# Patient Record
Sex: Male | Born: 1945 | Race: White | Hispanic: No | Smoking: Former smoker
Health system: Southern US, Community
[De-identification: ages and names within clinical notes are randomized; demographics above are authoritative.]

## PROBLEM LIST (undated history)

## (undated) DIAGNOSIS — G8929 Other chronic pain: Secondary | ICD-10-CM

## (undated) DIAGNOSIS — K219 Gastro-esophageal reflux disease without esophagitis: Secondary | ICD-10-CM

## (undated) DIAGNOSIS — B009 Herpesviral infection, unspecified: Secondary | ICD-10-CM

## (undated) DIAGNOSIS — M549 Dorsalgia, unspecified: Secondary | ICD-10-CM

## (undated) DIAGNOSIS — F1120 Opioid dependence, uncomplicated: Secondary | ICD-10-CM

## (undated) DIAGNOSIS — I10 Essential (primary) hypertension: Secondary | ICD-10-CM

## (undated) DIAGNOSIS — G14 Postpolio syndrome: Secondary | ICD-10-CM

## (undated) DIAGNOSIS — E785 Hyperlipidemia, unspecified: Secondary | ICD-10-CM

## (undated) HISTORY — DX: Dorsalgia, unspecified: M54.9

## (undated) HISTORY — DX: Opioid dependence, uncomplicated: F11.20

## (undated) HISTORY — DX: Herpesviral infection, unspecified: B00.9

## (undated) HISTORY — DX: Gastro-esophageal reflux disease without esophagitis: K21.9

## (undated) HISTORY — DX: Other chronic pain: G89.29

## (undated) HISTORY — DX: Postpolio syndrome: G14

## (undated) HISTORY — DX: Hyperlipidemia, unspecified: E78.5

## (undated) HISTORY — DX: Essential (primary) hypertension: I10

---

## 2004-04-28 ENCOUNTER — Ambulatory Visit: Payer: Self-pay | Admitting: Psychiatry

## 2004-04-28 ENCOUNTER — Inpatient Hospital Stay (HOSPITAL_COMMUNITY): Admission: RE | Admit: 2004-04-28 | Discharge: 2004-05-03 | Payer: Self-pay | Admitting: Psychiatry

## 2005-12-14 ENCOUNTER — Ambulatory Visit: Payer: Self-pay | Admitting: Anesthesiology

## 2006-01-16 ENCOUNTER — Emergency Department: Payer: Self-pay | Admitting: Emergency Medicine

## 2006-02-06 ENCOUNTER — Ambulatory Visit: Payer: Self-pay | Admitting: Anesthesiology

## 2006-02-09 ENCOUNTER — Ambulatory Visit: Payer: Self-pay | Admitting: Otolaryngology

## 2006-02-09 ENCOUNTER — Other Ambulatory Visit: Payer: Self-pay

## 2006-02-16 ENCOUNTER — Ambulatory Visit: Payer: Self-pay | Admitting: Otolaryngology

## 2006-03-15 ENCOUNTER — Ambulatory Visit: Payer: Self-pay | Admitting: Anesthesiology

## 2006-04-13 ENCOUNTER — Ambulatory Visit: Payer: Self-pay | Admitting: Anesthesiology

## 2006-05-17 ENCOUNTER — Ambulatory Visit: Payer: Self-pay | Admitting: Anesthesiology

## 2006-05-22 ENCOUNTER — Ambulatory Visit: Payer: Self-pay | Admitting: Anesthesiology

## 2006-07-25 ENCOUNTER — Ambulatory Visit: Payer: Self-pay | Admitting: Anesthesiology

## 2006-08-01 ENCOUNTER — Ambulatory Visit: Payer: Self-pay | Admitting: Anesthesiology

## 2006-09-20 ENCOUNTER — Ambulatory Visit: Payer: Self-pay | Admitting: Anesthesiology

## 2006-11-28 ENCOUNTER — Inpatient Hospital Stay (HOSPITAL_COMMUNITY): Admission: RE | Admit: 2006-11-28 | Discharge: 2006-11-29 | Payer: Self-pay | Admitting: Neurosurgery

## 2007-01-11 ENCOUNTER — Encounter: Admission: RE | Admit: 2007-01-11 | Discharge: 2007-01-11 | Payer: Self-pay | Admitting: Neurosurgery

## 2007-05-23 ENCOUNTER — Ambulatory Visit: Payer: Self-pay | Admitting: Family Medicine

## 2007-06-22 ENCOUNTER — Ambulatory Visit: Payer: Self-pay | Admitting: Anesthesiology

## 2007-06-28 ENCOUNTER — Ambulatory Visit: Payer: Self-pay | Admitting: Anesthesiology

## 2007-08-01 ENCOUNTER — Encounter: Admission: RE | Admit: 2007-08-01 | Discharge: 2007-08-01 | Payer: Self-pay | Admitting: Neurosurgery

## 2007-08-15 ENCOUNTER — Ambulatory Visit: Payer: Self-pay | Admitting: Anesthesiology

## 2007-09-06 ENCOUNTER — Ambulatory Visit: Payer: Self-pay | Admitting: Anesthesiology

## 2007-09-26 ENCOUNTER — Ambulatory Visit: Payer: Self-pay | Admitting: Anesthesiology

## 2007-10-10 ENCOUNTER — Ambulatory Visit: Payer: Self-pay | Admitting: Anesthesiology

## 2007-11-08 ENCOUNTER — Ambulatory Visit: Payer: Self-pay | Admitting: Anesthesiology

## 2007-12-04 ENCOUNTER — Emergency Department: Payer: Self-pay | Admitting: Emergency Medicine

## 2007-12-13 ENCOUNTER — Ambulatory Visit: Payer: Self-pay | Admitting: Anesthesiology

## 2007-12-28 ENCOUNTER — Inpatient Hospital Stay: Payer: Self-pay | Admitting: Internal Medicine

## 2007-12-28 ENCOUNTER — Other Ambulatory Visit: Payer: Self-pay

## 2007-12-31 ENCOUNTER — Ambulatory Visit: Payer: Self-pay | Admitting: Anesthesiology

## 2008-01-16 ENCOUNTER — Ambulatory Visit: Payer: Self-pay | Admitting: Anesthesiology

## 2008-02-09 ENCOUNTER — Emergency Department: Payer: Self-pay | Admitting: Emergency Medicine

## 2008-02-13 ENCOUNTER — Ambulatory Visit: Payer: Self-pay | Admitting: Anesthesiology

## 2008-03-11 ENCOUNTER — Ambulatory Visit: Payer: Self-pay | Admitting: Anesthesiology

## 2008-11-07 ENCOUNTER — Ambulatory Visit: Payer: Self-pay | Admitting: Neurosurgery

## 2009-01-20 ENCOUNTER — Ambulatory Visit: Payer: Self-pay | Admitting: Family Medicine

## 2009-02-11 ENCOUNTER — Ambulatory Visit: Payer: Self-pay | Admitting: Gastroenterology

## 2009-02-24 ENCOUNTER — Ambulatory Visit: Payer: Self-pay | Admitting: Gastroenterology

## 2009-05-31 ENCOUNTER — Ambulatory Visit: Payer: Self-pay | Admitting: Neurosurgery

## 2009-06-09 ENCOUNTER — Ambulatory Visit (HOSPITAL_COMMUNITY): Admission: RE | Admit: 2009-06-09 | Discharge: 2009-06-09 | Payer: Self-pay | Admitting: Neurosurgery

## 2009-06-11 ENCOUNTER — Ambulatory Visit (HOSPITAL_COMMUNITY): Admission: RE | Admit: 2009-06-11 | Discharge: 2009-06-11 | Payer: Self-pay | Admitting: Interventional Radiology

## 2009-06-26 ENCOUNTER — Encounter: Payer: Self-pay | Admitting: Interventional Radiology

## 2009-07-30 ENCOUNTER — Ambulatory Visit (HOSPITAL_COMMUNITY): Admission: RE | Admit: 2009-07-30 | Discharge: 2009-07-30 | Payer: Self-pay | Admitting: Interventional Radiology

## 2009-08-21 ENCOUNTER — Encounter: Payer: Self-pay | Admitting: Interventional Radiology

## 2010-09-08 LAB — BASIC METABOLIC PANEL
BUN: 14 mg/dL (ref 6–23)
Calcium: 9.4 mg/dL (ref 8.4–10.5)
Creatinine, Ser: 0.69 mg/dL (ref 0.4–1.5)
GFR calc non Af Amer: >60 mL/min (ref >60)
Glucose, Bld: 97 mg/dL (ref 70–99)
Potassium: 4.3 mEq/L (ref 3.5–5.1)
Sodium: 140 mEq/L (ref 135–145)

## 2010-09-08 LAB — TYPE AND SCREEN: Antibody Screen: NEGATIVE

## 2010-09-08 LAB — DIFFERENTIAL
Basophils Absolute: 0.1 10*3/uL (ref 0.0–0.1)
Lymphocytes Relative: 22 % (ref 12–46)
Monocytes Absolute: 0.8 10*3/uL (ref 0.1–1.0)
Neutro Abs: 4.5 10*3/uL (ref 1.7–7.7)

## 2010-09-08 LAB — CBC
MCV: 92.5 fL (ref 78.0–100.0)
Platelets: 251 10*3/uL (ref 150–400)
RDW: 14.6 % (ref 11.5–15.5)

## 2010-09-08 LAB — HEPATIC FUNCTION PANEL
ALT: 16 U/L (ref 0–53)
AST: 22 U/L (ref 0–37)
Bilirubin, Direct: 0.1 mg/dL (ref 0.0–0.3)

## 2010-09-08 LAB — APTT: aPTT: 30 seconds (ref 24–37)

## 2010-09-09 ENCOUNTER — Ambulatory Visit
Admission: RE | Admit: 2010-09-09 | Discharge: 2010-09-09 | Disposition: A | Discharge status: Home / Self Care | Payer: Medicare Other | Source: Ambulatory Visit | Attending: Neurosurgery | Admitting: Neurosurgery

## 2010-09-09 ENCOUNTER — Other Ambulatory Visit: Payer: Self-pay | Admitting: Neurosurgery

## 2010-09-09 DIAGNOSIS — M542 Cervicalgia: Secondary | ICD-10-CM

## 2010-09-20 LAB — CBC
Platelets: 272 10*3/uL (ref 150–400)
WBC: 5.5 10*3/uL (ref 4.0–10.5)

## 2010-09-20 LAB — BASIC METABOLIC PANEL
BUN: 9 mg/dL (ref 6–23)
Calcium: 8.8 mg/dL (ref 8.4–10.5)
Creatinine, Ser: 0.56 mg/dL (ref 0.4–1.5)
GFR calc non Af Amer: >60 mL/min (ref >60)
Potassium: 3.4 mEq/L — ABNORMAL LOW (ref 3.5–5.1)

## 2010-09-20 LAB — CREATININE, SERUM
Creatinine, Ser: 0.58 mg/dL (ref 0.4–1.5)
GFR calc non Af Amer: >60 mL/min (ref >60)

## 2010-09-20 LAB — BUN: BUN: 7 mg/dL (ref 6–23)

## 2010-09-20 LAB — APTT: aPTT: 31 seconds (ref 24–37)

## 2010-09-22 ENCOUNTER — Ambulatory Visit: Payer: Self-pay | Admitting: Neurosurgery

## 2010-09-30 ENCOUNTER — Encounter (HOSPITAL_COMMUNITY)
Admission: RE | Admit: 2010-09-30 | Discharge: 2010-09-30 | Disposition: A | Discharge status: Home / Self Care | Payer: Medicare Other | Source: Ambulatory Visit | Attending: Neurosurgery | Admitting: Neurosurgery

## 2010-09-30 ENCOUNTER — Other Ambulatory Visit (HOSPITAL_COMMUNITY): Payer: Self-pay | Admitting: Neurosurgery

## 2010-09-30 ENCOUNTER — Ambulatory Visit (HOSPITAL_COMMUNITY)
Admission: RE | Admit: 2010-09-30 | Discharge: 2010-09-30 | Disposition: A | Discharge status: Home / Self Care | Payer: Medicare Other | Source: Ambulatory Visit | Attending: Neurosurgery | Admitting: Neurosurgery

## 2010-09-30 DIAGNOSIS — Z01810 Encounter for preprocedural cardiovascular examination: Secondary | ICD-10-CM | POA: Insufficient documentation

## 2010-09-30 DIAGNOSIS — R059 Cough, unspecified: Secondary | ICD-10-CM | POA: Insufficient documentation

## 2010-09-30 DIAGNOSIS — F17200 Nicotine dependence, unspecified, uncomplicated: Secondary | ICD-10-CM | POA: Insufficient documentation

## 2010-09-30 DIAGNOSIS — R05 Cough: Secondary | ICD-10-CM | POA: Insufficient documentation

## 2010-09-30 DIAGNOSIS — M5020 Other cervical disc displacement, unspecified cervical region: Secondary | ICD-10-CM | POA: Insufficient documentation

## 2010-09-30 DIAGNOSIS — Z01818 Encounter for other preprocedural examination: Secondary | ICD-10-CM | POA: Insufficient documentation

## 2010-09-30 DIAGNOSIS — Z01812 Encounter for preprocedural laboratory examination: Secondary | ICD-10-CM | POA: Insufficient documentation

## 2010-09-30 DIAGNOSIS — I10 Essential (primary) hypertension: Secondary | ICD-10-CM | POA: Insufficient documentation

## 2010-09-30 DIAGNOSIS — M5000 Cervical disc disorder with myelopathy, unspecified cervical region: Secondary | ICD-10-CM

## 2010-09-30 DIAGNOSIS — J449 Chronic obstructive pulmonary disease, unspecified: Secondary | ICD-10-CM | POA: Insufficient documentation

## 2010-09-30 DIAGNOSIS — J4489 Other specified chronic obstructive pulmonary disease: Secondary | ICD-10-CM | POA: Insufficient documentation

## 2010-09-30 LAB — COMPREHENSIVE METABOLIC PANEL
ALT: 13 U/L (ref 0–53)
AST: 20 U/L (ref 0–37)
Alkaline Phosphatase: 102 U/L (ref 39–117)
CO2: 34 mEq/L — ABNORMAL HIGH (ref 19–32)
Glucose, Bld: 89 mg/dL (ref 70–99)
Potassium: 4.7 mEq/L (ref 3.5–5.1)
Sodium: 137 mEq/L (ref 135–145)
Total Protein: 7 g/dL (ref 6.0–8.3)

## 2010-09-30 LAB — CBC
HCT: 42.8 % (ref 39.0–52.0)
Hemoglobin: 14 g/dL (ref 13.0–17.0)
RDW: 14 % (ref 11.5–15.5)
WBC: 7.4 10*3/uL (ref 4.0–10.5)

## 2010-09-30 LAB — SURGICAL PCR SCREEN: Staphylococcus aureus: NEGATIVE

## 2010-10-01 LAB — TYPE AND SCREEN

## 2010-10-04 ENCOUNTER — Inpatient Hospital Stay (HOSPITAL_COMMUNITY)
Admission: RE | Admit: 2010-10-04 | Discharge: 2010-10-05 | DRG: 473 | Disposition: A | Discharge status: Home / Self Care | Payer: Medicare Other | Source: Ambulatory Visit | Attending: Neurosurgery | Admitting: Neurosurgery

## 2010-10-04 ENCOUNTER — Inpatient Hospital Stay (HOSPITAL_COMMUNITY): Payer: Medicare Other

## 2010-10-04 DIAGNOSIS — M5000 Cervical disc disorder with myelopathy, unspecified cervical region: Principal | ICD-10-CM | POA: Diagnosis present

## 2010-10-04 DIAGNOSIS — F17200 Nicotine dependence, unspecified, uncomplicated: Secondary | ICD-10-CM | POA: Diagnosis present

## 2010-10-04 DIAGNOSIS — M4802 Spinal stenosis, cervical region: Secondary | ICD-10-CM | POA: Diagnosis present

## 2010-10-04 DIAGNOSIS — I10 Essential (primary) hypertension: Secondary | ICD-10-CM | POA: Diagnosis present

## 2010-10-04 DIAGNOSIS — B91 Sequelae of poliomyelitis: Secondary | ICD-10-CM

## 2010-11-02 NOTE — Op Note (Signed)
NAME:  Alexander Wong NO.:  [id number]   MEDICAL RECORD NO.:  [id number]          PATIENT TYPE:  INP   LOCATION:  3172                         FACILITY:  [facility]   PHYSICIAN:  Kathaleen Maser. Pool, M.D.    DATE OF BIRTH:  02/07/1946   DATE OF PROCEDURE:  11/28/2006  DATE OF DISCHARGE:                               OPERATIVE REPORT   PREOPERATIVE DIAGNOSIS:  Cervical stenosis with myelopathy.   POSTOPERATIVE DIAGNOSIS:  Cervical stenosis with myelopathy.   PROCEDURE NOTE:  C4-C5, C5-C6 and C6-C7 anterior cervical discectomy and  fusion with allograft and anterior plating.   SURGEON:  Kathaleen Maser. Pool, M.D.   ASSISTANT:  Reinaldo Meeker, M.D.   ANESTHESIA:  General endotracheal anesthesia.   INDICATIONS FOR PROCEDURE:  Mr. Alexander Wong is a 65 year old male who suffers  from progressive upper extremity weakness and lower extremity ataxia.  Workup demonstrates evidence of marked cervical stenosis with spinal  cord and nerve root compression at C4-C5, C5-C6 and C6-C7 levels.  The  patient has been counseled as to his options.  He has decided to proceed  with a three level anterior cervical discectomy and fusion with  allograft and plating in hopes of improving his symptoms.   OPERATIVE NOTE:  The patient was brought to the operating room and  placed on the operating table in the supine position. After an adequate  level of anesthesia was achieved, the patient was positioned supine with  his neck slightly extended and held in place with halter traction.  The  patient's anterior cervical region was prepped and draped sterilely.  A  10 blade was used to make a linear incision overlying the C5-C6  interspace.  This was carried down sharply to the platysma.  The  platysma was then divided vertically. Dissection continued along the  medial border of the sternomastoid muscle and carotid sheath.  The  trachea and esophagus were mobilized and retracted towards the left.  The  prevertebral fascia was stripped off the anterior spinal column.  The longus colli muscle was elevated bilaterally using electrocautery.  Deep self-retaining retractor was placed.  Intraoperative fluoroscopy  was used and C4-C5, C5-C6, and C6-C7 levels were confirmed.   The disc space at the L3 level was then incised with a 15 blade in  rectangular fashion.  Anterior osteophytes were removed using Leksell  rongeurs.  Aggressive discectomies were performed at all three levels  using pituitary rongeurs, forward and back Carlen curets, Kerrison  rongeurs, and a high speed drill.  All elements of the disc were removed  down to the level of the posterior annulus.  The microscope was then  brought to the field and used throughout the remainder the discectomy.  Starting first at C4-C5, the remaining aspects of the annulus and  osteophytes were removed using the high speed drill down to the  posterior longitudinal ligament.  The posterior longitudinal ligament  was elevated and resected in a piecemeal fashion using Kerrison  rongeurs.  The underlying thecal sac was identified.  A wide central  decompression was then performed under cutting the bodies  of C4 and C5.  Decompression proceeded out each neural foramen.  Wide anterior  foraminotomies were then performed along the course of the exiting C5  nerve roots bilaterally.  At this point, a very thorough decompression  had been achieved.  There was no evidence of injury to the thecal sac or  nerve roots.  The procedure was then repeated at C5-C6 and C6-C7, again  without complication.  The wound was then irrigated with antibiotic  solution.  Gelfoam was placed topically for hemostasis which was found  be good.   A 5 mm Lifenet allograft wedge then placed at C5-C6 and impacted to  approximately 1 mm beyond the anterior cortical surface of C5-C6.  6 mm  grafts were placed at C4-C5 and C6-C7.  A 62 mm Atlantis anterior  cervical plate was then  placed over the C4, C5, C6 and C7 levels.  This  was attached under fluoroscopic guidance using 13 mm variable angle  screws, two each at all four levels.  All screws were given final  tightening and noted to be solid in bone.  The locking screws were then  engaged at all levels.  Final images revealed good position bone grafts  and hardware.  The wound was then irrigated with antibiotic solution.  Gelfoam was placed topically for hemostasis.  We then closed in typical  fashion.  Steri-Strips and sterile dressings were applied.  There were  no intraoperative complications.  The patient tolerated the procedure  well and he returns to the recovery room for postoperative care.           ______________________________  Kathaleen Maser. Pool, M.D.     HAP/MEDQ  D:  11/28/2006  T:  11/28/2006  Job:  [id number]

## 2010-11-03 ENCOUNTER — Ambulatory Visit
Admission: RE | Admit: 2010-11-03 | Discharge: 2010-11-03 | Disposition: A | Discharge status: Home / Self Care | Payer: Medicare Other | Source: Ambulatory Visit | Attending: Neurosurgery | Admitting: Neurosurgery

## 2010-11-03 ENCOUNTER — Other Ambulatory Visit: Payer: Self-pay | Admitting: Neurosurgery

## 2010-11-03 DIAGNOSIS — M542 Cervicalgia: Secondary | ICD-10-CM

## 2010-11-04 NOTE — Op Note (Signed)
NAME:  Alexander Wong, Alexander Wong NO.:  [id number]  MEDICAL RECORD NO.:  [id number]           PATIENT TYPE:  I  LOCATION:  3533                         FACILITY:  [facility]  PHYSICIAN:  Kathaleen Maser. Malea Swilling, M.D.    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  PROCEDURE NAME:  Re-exploration of C4-C7 anterior cervical fusion. Removal of C4-C7 anterior plate instrumentation.  C3-4 anterior cervical diskectomy and fusion with allograft and plating.  SURGEON:  Kathaleen Maser. Jevante Hollibaugh, MD  ASSISTANT:  Donalee Citrin, MD  ANESTHESIA:  General endotracheal.  INDICATIONS:  Alexander Wong is a 65 year old male with history of neck pain and bilateral upper extremity weakness.  Workup demonstrates evidence of a large central disk herniation at C3-4.  The patient is status post previous C4 to C7 anterior fusion which appears solid.  The patient has been counseled as to his options.  He decided to proceed with C3-4 anterior cervical facet fusion, allograft plating in hopes of improving his symptoms.  The patient was brought to the operating room and placed on the operative table in supine position.  After adequate level of anesthesia was achieved, the patient was placed supine with the neck slightly extended, held in place with halter traction.  The patient's anterior cervical region was prepped and draped sterilely.  A 10 blade was used to make a curvilinear skin incision overlying the C3-4 interspace.  This was carried down sharply to the platysma.  The platysma was then divided vertically.  Dissection proceeded along  the medial border of the sternocleidomastoid muscle and carotid sheath.  Trachea and esophagus are mobilized, tracked towards the left.  Prevertebral fascia was stripped off the anterior spinal column.  Longus colli muscles were elevated bilaterally using electrocautery.  Deep self-retaining retractor was placed.  Intraoperative fluoroscopy was used and levels were confirmed.  Previously placed anterior plate instrumentation from C4 to C7 was dissected free.  The plate was disassembled and removed. Fusions were inspected and found to be solid.  Disk space at C3-4 was then incised with 15 blade in rectangular fashion.  Wide disk space clean-out was then achieved using pituitary rongeurs, forward and backward Karlin curettes, Kerrison rongeurs, high-speed drill.  All elements of the disk were removed down to the level of the posterior annulus.  Microscope was then brought into the field and used throughout the remainder of the diskectomy.  The remaining aspects of annulus and osteophytes were removed using high-speed drill down to the level of theposterior longitudinal ligament.  The posterior longitudinal ligament was then elevated and resected in piecemeal fashion using Kerrison rongeurs.  The underlying thecal sac was then identified.  Wide central decompression was then performed undercutting the bodies of C3 and C4. Decompression was then proceeded out to the edges of the neural  foramen. Wide anterior foraminotomies were then performed along the course of the exiting C4 nerve roots bilaterally.  At this point, a very thorough decompression had been achieved.  There was no injury to thecal sac or nerve roots.  Wound was then irrigated with antibiotic solution. Gelfoam was placed topically for hemostasis found to be good.  A 7-mm Cornerstone allograft wedge was then placed in between the C3 and C4 levels.  This was then attached under fluoroscopic guidance using 13-mm variable angle  screws, two each at both levels.  All screws given final tightening.  C-arm was then used to take final images, revealed good position of bone grafts, Hardware at proper operative level with normal alignment of spine.  The wound was then irrigated with antibiotic solution.  Hemostasis in the wound was then achieved with electrocautery.  The wound was then closed in layers.  Steri-Strips and sterile dressings were applied.  There were no complications.  The patient tolerated the procedure well, and he returned to the recovery room postoperatively.          ______________________________ Kathaleen Maser Shanti Eichel, M.D.     HAP/MEDQ  D:  10/04/2010  T:  10/05/2010  Job:  [id number]  Electronically Signed by Julio Sicks M.D. on 11/04/2010 11:15:34 AM

## 2010-11-05 ENCOUNTER — Encounter (HOSPITAL_COMMUNITY)
Admission: RE | Admit: 2010-11-05 | Discharge: 2010-11-05 | Disposition: A | Discharge status: Home / Self Care | Payer: Medicare Other | Source: Ambulatory Visit | Attending: Neurosurgery | Admitting: Neurosurgery

## 2010-11-05 LAB — DIFFERENTIAL
Eosinophils Relative: 2 % (ref 0–5)
Lymphocytes Relative: 45 % (ref 12–46)
Lymphs Abs: 2.4 10*3/uL (ref 0.7–4.0)
Neutro Abs: 2.2 10*3/uL (ref 1.7–7.7)

## 2010-11-05 LAB — BASIC METABOLIC PANEL
BUN: 12 mg/dL (ref 6–23)
CO2: 35 mEq/L — ABNORMAL HIGH (ref 19–32)
Chloride: 99 mEq/L (ref 96–112)
Glucose, Bld: 88 mg/dL (ref 70–99)
Potassium: 4.2 mEq/L (ref 3.5–5.1)
Sodium: 138 mEq/L (ref 135–145)

## 2010-11-05 LAB — TYPE AND SCREEN

## 2010-11-05 LAB — CBC
HCT: 39.2 % (ref 39.0–52.0)
Hemoglobin: 12.9 g/dL — ABNORMAL LOW (ref 13.0–17.0)
MCV: 93.1 fL (ref 78.0–100.0)
RBC: 4.21 MIL/uL — ABNORMAL LOW (ref 4.22–5.81)
RDW: 14.2 % (ref 11.5–15.5)
WBC: 5.4 10*3/uL (ref 4.0–10.5)

## 2010-11-05 LAB — SURGICAL PCR SCREEN
MRSA, PCR: NEGATIVE
Staphylococcus aureus: NEGATIVE

## 2010-11-05 NOTE — Discharge Summary (Signed)
NAME:  Alexander Wong, Alexander Wong NO.:  [id number]   MEDICAL RECORD NO.:  [id number]          PATIENT TYPE:  IPS   LOCATION:  0504                          [facility]   PHYSICIAN:  Geoffery Lyons, M.D.      DATE OF BIRTH:  18-Apr-1946   DATE OF ADMISSION:  04/28/2004  DATE OF DISCHARGE:  05/03/2004                                 DISCHARGE SUMMARY   CHIEF COMPLAINT AND PRESENT ILLNESS:  This was the first admission to [facility] for this 65 year old, divorced, white male,  voluntarily admitted.  Requested detox from opiates.  Presented himself at  Mental [facility] [place] who referred him.  Prescribed 120 mg  of OxyContin every day for chronic pain in his back __________ post polio  syndrome.  Also prescribed Percocet one to two every six hours as needed for  breakthrough pain.  Has been using OxyContin for about seven years.  Reports  he has been abusing it on and off for most of that time.  Has been chewing  OxyContin that he gets off the street, taking up to 200 to 280 mg per day.  The day before, had used up to 300.  Tried to chew the patch.  Attending a  pain clinic here in [facility].  They prescribed methadone.  It was not  enough, so he went back to using OxyContin.   PAST PSYCHIATRIC HISTORY:  First time [employer].  Spent 28 days in  [facility] in [place] to get off alcohol.   ALCOHOL/DRUG HISTORY:  As already stated.  Ongoing abuse of OxyContin,  history of alcohol abuse, sober since 1992, history of cocaine abuse in the  distant past as well as of heroin.   PAST MEDICAL HISTORY:  1.  Dyslipidemia.  2.  Chronic back pain.  3.  Post polio syndrome.  __________.  4.  History of ulcer from NSAIDs (nonsteroidal anti-inflammatories).   LABORATORY WORKUP:  TSH 1.022.  Urine drug screen positive for cocaine,  opiates and benzodiazepines.  CBC within normal limits.  Other blood  chemistries findings within  normal limits.   MENTAL STATUS EXAM:  Upon admission exam reveals a fully alert male,  pleasant, cooperative, anxious, tearful, endorsing significant pain.  Speech  was normal in pace, tempo and production.  Mood was depressed.  Affect was  anxious, depressed.  Thought processes were logical, clear, goal-directed.  No delusions.  No hallucinations.  Wanting to find ways of dealing with the  pain.  Cognition well-preserved.   ADMISSION DIAGNOSES:   AXIS I:  1.  Opioid abuse, rule out dependence.  2.  Polysubstance abuse.  3.  Depressive disorder, not otherwise specified.   AXIS II:  No diagnosis.   AXIS III:  1.  Herpes.  2.  Post polio syndrome.  3.  Chronic back and neck pain.   AXIS IV:  Moderate.   AXIS V:  Global Assessment of Functioning upon admission was 25; highest  Global Assessment of Functioning in the last year 60.   COURSE IN HOSPITAL:  He was admitted and started in individual and group  psychotherapy.  He was detoxified with Librium and clonidine.  He was placed  on lovastatin 40 mg daily, Nexium 40 mg daily, Allegra 180 mg daily,  Nasacort two sprays each nostril every day, Lotrel 5/20 mg every day,  Valtrex 500 every 12 hours, Zoloft 25 mg daily, Neurontin 300 at night.  He  was later started on Flexeril 5 mg three times a day.  He had a hard time  with the detox.  Severe muscle ache, back ache.  Endorsed that he quit  alcohol and heroin and it was not as hard as quitting the opioids.  Difficulty with sleep.  As the detox progressed, he started feeling better.  Mood was better.  The physical withdrawal was better.  He was given some  Neurontin and he was placed on Zoloft.  Neurontin was eventually increased  to 300 four times a day and Zoloft was increased to 75 mg per day.  November  14th he was in full contact with reality.  There were no suicide ideas, no  homicide ideas, no hallucinations, no delusions.  He claimed that he felt so  much better, he was  free of detox, committed to not going back to using.   DISCHARGE DIAGNOSES:   AXIS I:  1.  Opioid dependence.  2.  Polysubstance abuse.  3.  Depressive disorder, not otherwise specified.   AXIS II:  No diagnosis.   AXIS III:  1.  Herpes.  2.  Post polio syndrome.  3.  Chronic back and neck pain.   AXIS IV:  Moderate.   AXIS V:  Global Assessment of Functioning upon discharge was 55.   DISCHARGE MEDICATIONS:  1.  Lovastatin 40 mg per day.  2.  Nexium 40 mg per day.  3.  Allegra 180 mg daily.  4.  Nasacort spray two sprays each nostril.  5.  Lotrel 5/20 one daily.  6.  Valtrex 500 every 12 hours.  7.  Neurontin 300 one five times a day.  8.  Zoloft 50 one and a half daily.   FOLLOW UP:  Follow-up at [facility].     Farrel Gordon   IL/MEDQ  D:  05/26/2004  T:  05/27/2004  Job:  [id number]

## 2010-11-05 NOTE — H&P (Signed)
NAME:  Alexander Wong, Alexander Wong NO.:  [id number]   MEDICAL RECORD NO.:  [id number]          PATIENT TYPE:  IPS   LOCATION:  0504                          [facility]ILITY[facility]  [facility]   PHYSICIAN:  Geoffery Lyons, M.D.      DATE OF BIRTH:  [birth year]   DATE OF ADMISSION:  04/28/2004  DATE OF DISCHARGE:                         PSYCHIATRIC ADMISSION ASSESSMENT   IDENTIFYING INFORMATION:  This is a 65 year old white male who is divorced.  This is a voluntary admission.   HISTORY OF PRESENT ILLNESS:  This patient requested detox from opiates and  presented himself at mental health [place] a [place] who referred him.  He is prescribed 120 mg of OxyContin every day for chronic pain in his back  that he attributes to postpolio syndrome.  In addition to that, he is  prescribed Percocet 1-2 tabs q.6h. p.r.n. for breakthrough pain.  He has  been using the OxyContin for about seven years and reports that he has been  abusing it on and off for most of that time.  He has been chewing OxyContin  that he gets off the street, taking up to typically 200 mg to 280 mg daily.  Yesterday, he used about 300 mg.  He has also tried the patch before but  ends up chewing it and trying to get the medication out of it.  He was  attending a pain clinic in [facility] and they prescribed methadone but  he felt that it did not do enough for him, so he went back to abusing the  OxyContin.  He endorses also a past history of alcohol abuse and has been  sober since 1992.  He feels the need to be off the OxyContin regardless of  what his pain is like because he believes that it has ruined his life.  [place]  [place] and  ended up using the money to try to buy the OxyContin.  He had been given as  part of an injury settlement he was able to buy a 1995 truck which he  recently sold for a cheaper version to use the extra money to buy OxyContin.  He also sold  his coin collection which was a valued hobby also for drug  money.  He feels that his life revolves around drugs and he wants to be away  from it.  He endorses occasional use of cocaine but denies cocaine cravings.  He endorses depressed mood, feeling irritable and sad but denies any  suicidal or homicidal thoughts.  He endorses a past history of abusing  heroin.  Denies any IV drug abuse.   PAST PSYCHIATRIC HISTORY:  This is the patient's first admission to [facility].  He has a history of using OxyContin for the  past 7-8 years.  Also a history of alcohol abuse.  Sober now since 1992.  History of cocaine abuse and past distant history of heroin abuse.  No prior  suicide attempts in the past or suicidal thoughts.  The patient spent  28  days in the [facility] in [place] to get off of alcohol.  Has  never attempted to detox off opiates in the past.   SOCIAL HISTORY:  This is a divorced white male, currently living with his  mother in [place].  He has been disabled since 1989.  After 1989,  when he was doing regular work at that time, he moved to [employer], worked  for a Production designer, theatre/television/film on and off.  Was an alcoholic for many years, then became  permanently disabled and has not worked since.  He denies any current legal  problems.   FAMILY HISTORY:  Grandfather with a history of substance abuse.  The extent  of that is unclear.   ALCOHOL/DRUG HISTORY:  As noted above.   MEDICAL HISTORY:  The patient is followed by Dr. Maryruth Bun in [place], [place], who is his primary care physician, who is treating him with Xanax.  Medical problems include dyslipidemia and problems with chronic back pain.  Has never had surgery on his back.  Has a history of postpolio syndrome with  some spinal curvature and muscle weakness, decreased motor use of his right  arm.  The patient also has a history of herpes not otherwise specified.  Past medical history is  remarkable for some arthritis in his back and spine.  Also has a past history of ulcers from NSAIDS although his physician  recently restarted him on ibuprofen to take regularly for the pain.  Has a  history of tonsillectomy and adenoidectomy and knee surgery twice and some  history of hypertension.   MEDICATIONS:  Valtrex 500 mg twice daily, Nasacort 2 sprays daily, OxyContin  120 mg p.o. q.d. is prescribed, lovastatin 40 mg daily, Nexium 40 mg daily.   ALLERGIES:  PENICILLIN, STREPTOMYCIN (both of which cause a rash).   POSITIVE PHYSICAL FINDINGS:  VITAL SIGNS:  This is a thin-appearing male who  is afebrile, pulse 78, respirations 20, blood pressure 141/92, weight 164  pounds, height 6 feet 2 inches tall.  GENERAL:  He is disheveled, lying in bed, appears to be underweight,  somewhat pale.  HEAD:  Normocephalic and atraumatic.  EENT:  Extraocular movements are normal.  PERRL.  Sclerae nonicteric.  A  little bit of clear rhinorrhea.  Oropharynx intact with poor dentition.  NECK:  Supple, no thyromegaly.  No carotid bruits.  Trachea is midline.  CHEST:  Clear to auscultation.  BREASTS:  Exam deferred.  CARDIOVASCULAR:  S1 and S2 heard.  No clicks, murmurs or gallops.  Apical  synchronous with radial.  ABDOMEN:  Flat, soft, nontender, nondistended.  Bowel sounds are mildly  hyperactive.  GENITOURINARY:  Deferred.  EXTREMITIES:  No edema.  He does have decreased tone and motor function if  his right arm and hand which are under-developed compared to the left.  SKIN:  Some crude tattoos over the left scapula and on his right arm.  Skin  is otherwise intact.  NEUROLOGIC:  Cranial nerves 2-12 are intact.  Extraocular movements are  normal.  Motor is smooth.  Balance and gait are satisfactory.  Motor is  decreased on the right.  He does not have full function of his hand,  including fine motor control is significantly impaired as is gross motor on the right arm but he is able to  accomplish his ADLs without difficulty.  Balance satisfactory.  Cerebellar function is intact.  Romberg without  findings.   LABORATORY DATA:  Urine drug screen was  positive for cocaine, opiates and  benzodiazepines.  His TSH is currently pending.  CBC and metabolic panel  were unremarkable.   MENTAL STATUS EXAM:  This is a fully alert male.  Pleasant and cooperative  but anxious.  Somewhat tearful.  Does endorse significant pain, which he is  not sure at this point is from the withdrawal or from the chronic pain that  he has in and of itself.  He scores his pain a 10/10 today.  Speech is  normal in pace, tone and amount.  Mood is depressed.  Thought processes is  logical, clear, goal directed.  He is quite candid about his drug use.  Feels that it is totally out of control.  Does not want to be on the  opiates.  We have discussed the options of going back on patches or other  methods to try to control his pain and if it is realistic for him to be off  the pain medications but he feels that it is and he wants to be completely  off of it.  Thought process is logical, clear, goal directed, no suicidal or  homicidal thoughts, no evidence of psychosis.  Cognitively, he is intact and  oriented x 3.  Able to list a lot of reasons why he wants to stay completely  clean and sober.   DIAGNOSES:   AXIS I:  1.  Opiate abuse and dependence.  2.  Depressive disorder not otherwise specified.  3.  Polysubstance abuse.   AXIS II:  Deferred.   AXIS III:  1.  Herpes.  2.  Postpolio syndrome.  3.  Chronic back and neck pain.   AXIS IV:  Severe (stress from the addiction).   AXIS V:  Current 25; past year 87.   PLAN:  To voluntarily admit the patient with 15-minute checks in place with  a goal of a safe detox in an attempt to get his pain under some reasonable  control and possibly decrease his pain score from a 10 to a 5 or 6 and to be  able to return him to his primary care physician.  We  have placed him on a  clonidine protocol to detox him.  We will also start him on a Librium  protocol and will also start him on Zoloft 25 mg p.o. daily.  Will obtain a  liver panel on him and restart his other routine medications.   ESTIMATED LENGTH OF STAY:  Five days.     Marg   MAS/MEDQ  D:  04/29/2004  T:  04/30/2004  Job:  [id number]

## 2010-11-08 ENCOUNTER — Inpatient Hospital Stay (HOSPITAL_COMMUNITY): Payer: Medicare Other

## 2010-11-08 ENCOUNTER — Inpatient Hospital Stay (HOSPITAL_COMMUNITY)
Admission: RE | Admit: 2010-11-08 | Discharge: 2010-11-10 | DRG: 030 | Disposition: A | Discharge status: Home / Self Care | Payer: Medicare Other | Source: Ambulatory Visit | Attending: Neurosurgery | Admitting: Neurosurgery

## 2010-11-08 DIAGNOSIS — M4802 Spinal stenosis, cervical region: Secondary | ICD-10-CM | POA: Diagnosis present

## 2010-11-08 DIAGNOSIS — IMO0002 Reserved for concepts with insufficient information to code with codable children: Principal | ICD-10-CM | POA: Diagnosis present

## 2010-11-08 DIAGNOSIS — W19XXXA Unspecified fall, initial encounter: Secondary | ICD-10-CM | POA: Diagnosis present

## 2010-11-08 DIAGNOSIS — I10 Essential (primary) hypertension: Secondary | ICD-10-CM | POA: Diagnosis present

## 2010-11-08 DIAGNOSIS — J4489 Other specified chronic obstructive pulmonary disease: Secondary | ICD-10-CM | POA: Diagnosis present

## 2010-11-08 DIAGNOSIS — J449 Chronic obstructive pulmonary disease, unspecified: Secondary | ICD-10-CM | POA: Diagnosis present

## 2010-11-08 DIAGNOSIS — Z981 Arthrodesis status: Secondary | ICD-10-CM

## 2010-11-18 NOTE — Op Note (Signed)
NAME:  Alexander Wong, Alexander Wong NO.:  [id number]  MEDICAL RECORD NO.:  [id number]           PATIENT TYPE:  I  LOCATION:  3011                         FACILITY:  [facility]  PHYSICIAN:  Kathaleen Maser. Jerris Keltz, M.D.    DATE OF BIRTH:  Nov 14, 1945  DATE OF PROCEDURE:  11/08/2010 DATE OF DISCHARGE:                              OPERATIVE REPORT   PREOPERATIVE DIAGNOSIS:  C3 fracture with stenosis and myelopathy. Status post C3-4 anterior cervical diskectomy and fusion with allograft plating.  POSTOPERATIVE DIAGNOSIS:  C3 fracture with stenosis and myelopathy. Status post C3-4 anterior cervical diskectomy and fusion with allograft plating.  PROCEDURE NAME: 1. Re-exploration of C3-4 anterior cervical fusion with removal of C3-     4 anterior cervical plate.  C3 corpectomy utilizing     microdissection.  C2 through C4 anterior strut graft fusion with     structural autograft and local autograft.  Anterior plate     inspissation from C4 to C2. 2. Posterior cervical fusion from C2 to C4 utilizing segmental lateral     mass screw instrumentation, local autograft, and Actifuse bone     graft extender.  SURGEON:  Kathaleen Maser. Cheyna Retana, MD  ANESTHESIA:  General.  INDICATIONS:  [name] is a 65 year old male approximate 1 month status post C3-4 anterior cervical diskectomy and fusion, allograft and plating.  The patient reports an awkward fall with onset of worsening neck pain.  Workup demonstrates evidence of a severe fracture of the patient's C3 body with retropulsion of the C3 body into the spinal canal.  The patient is significantly myelopathic.  We discussed options for management and he has decided to proceed with anterior cervical decompression with fusion and augmented with posterior cervical fusion.  OPERATIVE NOTE:  The patient was brought to the operating room, placed on the operative table in a supine position.  After adequate level of anesthesia was achieved, the patient was  positioned supine with the neck slightly extended and held in place with halter traction.  The patient's anterior cervical region was prepped and draped in sterilely.  A 10 blade was used to make a linear curvilinear skin incision overlying the C3 vertebral level.  This was carried down sharply to the platysma. Platysma was then divided vertically and dissection proceeded on the medial border of the sternomastoid muscle and carotid sheath.  Trachea and esophagus were mobilized and tracked towards the left.  Prevertebral fascia was stripped off the anterior spinal column.  Longus colli muscles were elevated bilaterally using electrocautery.  Previously placed anterior plate instrumentation at C3-4 was disassembled and removed.  The C3 body was obviously severely fractured.  The C3-4 allograft wedge remained in good position.  The allograft wedge was then removed.  Corpectomy was then performed by identifying the disk space at C2-3 curettaging all material out of the C3-4 disk space and aggressive diskectomy was then performed at C2-3 as well.  Bone was then removed in piecemeal fashion using high-speed drill, Kerrison rongeurs, and pituitary rongeurs to remove the body of L3.  This was carried down to the level of posterolateral ligament.  Posterolateral ligament  was elevated and resected in piecemeal fashion using Kerrison rongeurs was performed.  Wide central decompression of the spinal canal was performed.  All retropulsed bone was resected.  Diskectomy was carried out behind the body of C2 and behind the body of C4.  At this point, a very thorough decompression had been achieved.  There was no injury to thecal sac or nerve roots.  Caspar pins were used to distract the vertebral bodies.  A piece of structural fibular allograft was then cut to an appropriate size, packed with morselized autograft and then impacted into place.  Caspar pins were then removed.  A 42-mm Atlantis anterior  cervical plate was then placed over the C2-C4 level.  This was then attached under fluoroscopic guidance using 17-mm fixed and variable angle screws into C2 and 15-mm variable angle rescue screws into the C4. All four screws were given final tightening and found to be solid within bone.  Locking screws engaged at both levels.  Final images revealed good position of bone grafts, hardware at proper operative level with normal alignment of spine.  Wound was then irrigated with antibiotic solution.  Hemostasis was achieved with bipolar electrocautery.  Medium drains were left in the deep prevertebral space.  The wound was then closed in typical fashion.  Steri-Strips and sterile dressing were applied.  The patient was then taken from the operating bed, placed on the stretcher.  He was then turned prone with his head fixed in [place] pin headrest.  The patient's posterior cervical region was prepped and draped sterilely.  A 10 blade was used to make a linear incision overlying the C2, 3, 4 levels.  This was carried down sharply. Subperiosteal dissection was then performed through the lamina and facet joints of C2, C3, and C4.  Deep self-retaining retractor was placed. The intraoperative fluoroscopy was used, levels was confirmed.  Entry sites for pars intraarticular screws at C2 were obtained.  Pilot holes were drilled.  A path into the pars was then made using a high-speed drill.  The pilot hole was checked and found to be solid within bone.  A 14-mm vertex screw was then placed bilaterally at C2.  Lateral mass screws were placed at C3 and C4.  Pilot holes were drilled proximally 1 mm inferior and medial to the midpoint of the facet complex of C3 and C4 bilaterally.  Pilot holes were then drilled approximately 20 degrees cephalad and laterally directed.  The holes were probed, found to be solidly within bone.  A 14-mm screws were applied at both locations bilaterally.  The wound was then  irrigated with antibiotic solution. Short segment titanium rods were then cut and placed over the screw heads at C2-C4.  Locking caps were placed.  Locking caps were then engaged in the construct under gentle compression.  Lateral facets of C2, C3, and C4 were then aggressively decorticated.  Morselized autograft was packed posterolaterally for later fusion.  Retraction system was removed.  Hemostasis in the muscle was achieved with electrocautery.  Wound was then closed in layers with Vicryl suture. Steri-Strips and sterile dressing were applied.  Final images revealed good position of bone grafts, hardware in proper level, normal alignment of spine.  Wound was then dressed, and the patient returned to recovery room postoperatively in good condition.          ______________________________ Kathaleen Maser Carlos Quackenbush, M.D.     HAP/MEDQ  D:  11/08/2010  T:  11/09/2010  Job:  [id number]  Electronically Signed by  Julio Sicks M.D. on 11/18/2010 01:13:36 PM

## 2010-12-15 ENCOUNTER — Ambulatory Visit
Admission: RE | Admit: 2010-12-15 | Discharge: 2010-12-15 | Disposition: A | Discharge status: Home / Self Care | Payer: Medicare Other | Source: Ambulatory Visit | Attending: Neurosurgery | Admitting: Neurosurgery

## 2010-12-15 ENCOUNTER — Other Ambulatory Visit: Payer: Self-pay | Admitting: Neurosurgery

## 2010-12-15 DIAGNOSIS — M542 Cervicalgia: Secondary | ICD-10-CM

## 2010-12-24 ENCOUNTER — Other Ambulatory Visit (HOSPITAL_COMMUNITY): Payer: Self-pay | Admitting: Interventional Radiology

## 2010-12-24 DIAGNOSIS — I729 Aneurysm of unspecified site: Secondary | ICD-10-CM

## 2011-01-04 ENCOUNTER — Ambulatory Visit (HOSPITAL_COMMUNITY): Admission: RE | Admit: 2011-01-04 | Payer: Medicare Other | Source: Ambulatory Visit

## 2011-04-07 LAB — CBC
HCT: 38.8 — ABNORMAL LOW
Hemoglobin: 13.1
Platelets: 314
RDW: 16.7 — ABNORMAL HIGH
WBC: 5.3

## 2011-04-07 LAB — TYPE AND SCREEN: ABO/RH(D): O POS

## 2011-04-07 LAB — DIFFERENTIAL
Basophils Absolute: 0
Eosinophils Relative: 1
Lymphocytes Relative: 32
Lymphs Abs: 1.7
Monocytes Absolute: 0.7
Neutro Abs: 2.8

## 2011-04-07 LAB — BASIC METABOLIC PANEL
Calcium: 9.6
GFR calc non Af Amer: >60
Potassium: 3.6
Sodium: 140

## 2011-06-21 HISTORY — PX: CARDIAC CATHETERIZATION: SHX172

## 2011-06-21 HISTORY — PX: CORONARY ANGIOPLASTY: SHX604

## 2011-11-09 ENCOUNTER — Ambulatory Visit: Payer: Self-pay | Admitting: Family Medicine

## 2011-11-16 ENCOUNTER — Ambulatory Visit: Payer: Self-pay | Admitting: Specialist

## 2011-12-18 ENCOUNTER — Inpatient Hospital Stay: Payer: Self-pay | Admitting: Internal Medicine

## 2011-12-18 LAB — COMPREHENSIVE METABOLIC PANEL
Alkaline Phosphatase: 164 U/L — ABNORMAL HIGH (ref 50–136)
Bilirubin,Total: 0.3 mg/dL (ref 0.2–1.0)
Chloride: 103 mmol/L (ref 98–107)
Co2: 24 mmol/L (ref 21–32)
Osmolality: 290 (ref 275–301)
SGPT (ALT): 28 U/L
Total Protein: 8.7 g/dL — ABNORMAL HIGH (ref 6.4–8.2)

## 2011-12-18 LAB — CBC
HCT: 43.6 % (ref 40.0–52.0)
HGB: 12.8 g/dL — ABNORMAL LOW (ref 13.0–18.0)
MCH: 27.7 pg (ref 26.0–34.0)
MCHC: 29.4 g/dL — ABNORMAL LOW (ref 32.0–36.0)
MCV: 94 fL (ref 80–100)

## 2011-12-18 LAB — URINALYSIS, COMPLETE

## 2011-12-18 LAB — DRUG SCREEN, URINE
Amphetamines, Ur Screen: NEGATIVE (ref ?–1000)
Benzodiazepine, Ur Scrn: POSITIVE (ref ?–200)
Cannabinoid 50 Ng, Ur ~~LOC~~: NEGATIVE (ref ?–50)
Methadone, Ur Screen: POSITIVE (ref ?–300)
Opiate, Ur Screen: POSITIVE (ref ?–300)
Tricyclic, Ur Screen: NEGATIVE (ref ?–1000)

## 2011-12-18 LAB — CK TOTAL AND CKMB (NOT AT ARMC): CK-MB: 2.8 ng/mL (ref 0.5–3.6)

## 2011-12-19 DIAGNOSIS — R748 Abnormal levels of other serum enzymes: Secondary | ICD-10-CM

## 2011-12-19 DIAGNOSIS — I369 Nonrheumatic tricuspid valve disorder, unspecified: Secondary | ICD-10-CM

## 2011-12-19 LAB — CBC WITH DIFFERENTIAL/PLATELET
Basophil %: 0.1 %
Eosinophil %: 0 %
HGB: 12.5 g/dL — ABNORMAL LOW (ref 13.0–18.0)
Lymphocyte #: 0.5 10*3/uL — ABNORMAL LOW (ref 1.0–3.6)
Lymphocyte %: 7.8 %
Monocyte #: 0.5 x10 3/mm (ref 0.2–1.0)
Neutrophil #: 5.9 10*3/uL (ref 1.4–6.5)
Neutrophil %: 84.4 %
Platelet: 144 10*3/uL — ABNORMAL LOW (ref 150–440)
RBC: 4.41 10*6/uL (ref 4.40–5.90)
WBC: 7 10*3/uL (ref 3.8–10.6)

## 2011-12-19 LAB — BASIC METABOLIC PANEL
Calcium, Total: 7.7 mg/dL — ABNORMAL LOW (ref 8.5–10.1)
Chloride: 111 mmol/L — ABNORMAL HIGH (ref 98–107)
Co2: 23 mmol/L (ref 21–32)
Creatinine: 1.23 mg/dL (ref 0.60–1.30)
EGFR (African American): >60
EGFR (Non-African Amer.): >60
Potassium: 4.4 mmol/L (ref 3.5–5.1)
Sodium: 145 mmol/L (ref 136–145)

## 2011-12-19 LAB — TROPONIN I: Troponin-I: 0.22 ng/mL — ABNORMAL HIGH

## 2011-12-19 LAB — MAGNESIUM: Magnesium: 1.7 mg/dL — ABNORMAL LOW

## 2011-12-20 LAB — COMPREHENSIVE METABOLIC PANEL
Albumin: 2.6 g/dL — ABNORMAL LOW (ref 3.4–5.0)
Alkaline Phosphatase: 127 U/L (ref 50–136)
BUN: 23 mg/dL — ABNORMAL HIGH (ref 7–18)
Bilirubin,Total: 0.3 mg/dL (ref 0.2–1.0)
Co2: 25 mmol/L (ref 21–32)
Creatinine: 0.79 mg/dL (ref 0.60–1.30)
EGFR (Non-African Amer.): >60
Glucose: 138 mg/dL — ABNORMAL HIGH (ref 65–99)
Osmolality: 289 (ref 275–301)
SGPT (ALT): 35 U/L
Sodium: 142 mmol/L (ref 136–145)

## 2011-12-20 LAB — CBC WITH DIFFERENTIAL/PLATELET
Basophil #: 0 10*3/uL (ref 0.0–0.1)
Eosinophil #: 0 10*3/uL (ref 0.0–0.7)
Eosinophil %: 0.1 %
HGB: 12.1 g/dL — ABNORMAL LOW (ref 13.0–18.0)
Monocyte #: 0.8 x10 3/mm (ref 0.2–1.0)
Neutrophil %: 75.4 %
RBC: 4.18 10*6/uL — ABNORMAL LOW (ref 4.40–5.90)
WBC: 8.1 10*3/uL (ref 3.8–10.6)

## 2011-12-21 LAB — TROPONIN I: Troponin-I: 0.4 ng/mL — ABNORMAL HIGH

## 2011-12-21 LAB — PHENYTOIN LEVEL, TOTAL: Dilantin: 11.8 ug/mL (ref 10.0–20.0)

## 2011-12-23 DIAGNOSIS — I2510 Atherosclerotic heart disease of native coronary artery without angina pectoris: Secondary | ICD-10-CM

## 2011-12-23 LAB — COMPREHENSIVE METABOLIC PANEL
Albumin: 3 g/dL — ABNORMAL LOW (ref 3.4–5.0)
BUN: 9 mg/dL (ref 7–18)
Glucose: 92 mg/dL (ref 65–99)
Potassium: 3.1 mmol/L — ABNORMAL LOW (ref 3.5–5.1)
SGOT(AST): 34 U/L (ref 15–37)
SGPT (ALT): 25 U/L
Total Protein: 7 g/dL (ref 6.4–8.2)

## 2011-12-23 LAB — PROTIME-INR
INR: 1
Prothrombin Time: 13.7 secs (ref 11.5–14.7)

## 2011-12-24 LAB — CULTURE, BLOOD (SINGLE)

## 2011-12-26 ENCOUNTER — Telehealth: Payer: Self-pay

## 2011-12-26 NOTE — Telephone Encounter (Signed)
Transitional Case Mngt

## 2011-12-26 NOTE — Telephone Encounter (Signed)
I called pt to check on him post discharge.  He was d/c from hospital 12/23/11. He confirms he has all meds/RX he needs.  He understands med changes made in hosp.  He denies any symptoms at this time. His only complaint is the oxycodone tablet "getting stuck in my throat". He is having to split tablet into fourths.  He reassures me this has been a long standing problem with pills; is not a new issue.  He has appt with Dr. Sherrie Mustache 12/28/11 and will discuss with him at this time.  Otherwise we have confirmed appt with Dr. Mariah Milling 12/29/11.

## 2011-12-27 ENCOUNTER — Encounter: Payer: Self-pay | Admitting: Cardiovascular Disease

## 2011-12-29 ENCOUNTER — Encounter: Payer: Medicare Other | Admitting: Cardiovascular Disease

## 2011-12-29 ENCOUNTER — Encounter: Payer: Self-pay | Admitting: Cardiovascular Disease

## 2012-01-25 ENCOUNTER — Encounter: Payer: Self-pay | Admitting: Cardiovascular Disease

## 2012-02-17 ENCOUNTER — Other Ambulatory Visit: Payer: Self-pay | Admitting: Neurosurgery

## 2012-02-17 DIAGNOSIS — M48061 Spinal stenosis, lumbar region without neurogenic claudication: Secondary | ICD-10-CM

## 2012-02-23 ENCOUNTER — Inpatient Hospital Stay: Admission: RE | Admit: 2012-02-23 | Payer: Medicare Other | Source: Ambulatory Visit

## 2012-02-29 ENCOUNTER — Inpatient Hospital Stay: Admission: RE | Admit: 2012-02-29 | Payer: Medicare Other | Source: Ambulatory Visit

## 2012-03-06 ENCOUNTER — Inpatient Hospital Stay: Admission: RE | Admit: 2012-03-06 | Payer: Medicare Other | Source: Ambulatory Visit

## 2012-03-08 ENCOUNTER — Ambulatory Visit: Payer: Medicare Other | Admitting: Cardiovascular Disease

## 2012-04-23 ENCOUNTER — Emergency Department: Payer: Self-pay | Admitting: Emergency Medicine

## 2012-04-23 ENCOUNTER — Inpatient Hospital Stay (HOSPITAL_COMMUNITY)
Admission: EM | Admit: 2012-04-23 | Discharge: 2012-04-26 | DRG: 092 | Disposition: A | Discharge status: Home Health | Payer: Medicare Other | Attending: Family Medicine | Admitting: Family Medicine

## 2012-04-23 DIAGNOSIS — K219 Gastro-esophageal reflux disease without esophagitis: Secondary | ICD-10-CM | POA: Diagnosis present

## 2012-04-23 DIAGNOSIS — F411 Generalized anxiety disorder: Secondary | ICD-10-CM | POA: Diagnosis present

## 2012-04-23 DIAGNOSIS — B91 Sequelae of poliomyelitis: Secondary | ICD-10-CM

## 2012-04-23 DIAGNOSIS — T1491XA Suicide attempt, initial encounter: Secondary | ICD-10-CM | POA: Diagnosis present

## 2012-04-23 DIAGNOSIS — Z87891 Personal history of nicotine dependence: Secondary | ICD-10-CM

## 2012-04-23 DIAGNOSIS — B009 Herpesviral infection, unspecified: Secondary | ICD-10-CM | POA: Diagnosis present

## 2012-04-23 DIAGNOSIS — I671 Cerebral aneurysm, nonruptured: Principal | ICD-10-CM | POA: Diagnosis present

## 2012-04-23 DIAGNOSIS — G9389 Other specified disorders of brain: Secondary | ICD-10-CM

## 2012-04-23 DIAGNOSIS — W1809XA Striking against other object with subsequent fall, initial encounter: Secondary | ICD-10-CM | POA: Diagnosis present

## 2012-04-23 DIAGNOSIS — R45851 Suicidal ideations: Secondary | ICD-10-CM

## 2012-04-23 DIAGNOSIS — Z9181 History of falling: Secondary | ICD-10-CM

## 2012-04-23 DIAGNOSIS — Y92009 Unspecified place in unspecified non-institutional (private) residence as the place of occurrence of the external cause: Secondary | ICD-10-CM

## 2012-04-23 DIAGNOSIS — Z881 Allergy status to other antibiotic agents status: Secondary | ICD-10-CM

## 2012-04-23 DIAGNOSIS — I2510 Atherosclerotic heart disease of native coronary artery without angina pectoris: Secondary | ICD-10-CM | POA: Diagnosis present

## 2012-04-23 DIAGNOSIS — F3289 Other specified depressive episodes: Secondary | ICD-10-CM | POA: Diagnosis present

## 2012-04-23 DIAGNOSIS — F329 Major depressive disorder, single episode, unspecified: Secondary | ICD-10-CM | POA: Diagnosis present

## 2012-04-23 DIAGNOSIS — Z880 Allergy status to penicillin: Secondary | ICD-10-CM

## 2012-04-23 DIAGNOSIS — S42309A Unspecified fracture of shaft of humerus, unspecified arm, initial encounter for closed fracture: Secondary | ICD-10-CM | POA: Diagnosis present

## 2012-04-23 DIAGNOSIS — C719 Malignant neoplasm of brain, unspecified: Secondary | ICD-10-CM

## 2012-04-23 DIAGNOSIS — E785 Hyperlipidemia, unspecified: Secondary | ICD-10-CM | POA: Diagnosis present

## 2012-04-23 DIAGNOSIS — G8929 Other chronic pain: Secondary | ICD-10-CM | POA: Diagnosis present

## 2012-04-23 DIAGNOSIS — Z9861 Coronary angioplasty status: Secondary | ICD-10-CM

## 2012-04-23 DIAGNOSIS — W010XXA Fall on same level from slipping, tripping and stumbling without subsequent striking against object, initial encounter: Secondary | ICD-10-CM | POA: Diagnosis present

## 2012-04-23 DIAGNOSIS — I10 Essential (primary) hypertension: Secondary | ICD-10-CM | POA: Diagnosis present

## 2012-04-23 DIAGNOSIS — F32A Depression, unspecified: Secondary | ICD-10-CM | POA: Diagnosis present

## 2012-04-23 LAB — PHENYTOIN LEVEL, TOTAL: Dilantin: 1 ug/mL — ABNORMAL LOW (ref 10.0–20.0)

## 2012-04-23 LAB — COMPREHENSIVE METABOLIC PANEL
Bilirubin,Total: 0.6 mg/dL (ref 0.2–1.0)
Chloride: 101 mmol/L (ref 98–107)
Co2: 31 mmol/L (ref 21–32)
Creatinine: 0.64 mg/dL (ref 0.60–1.30)
EGFR (African American): >60
EGFR (Non-African Amer.): >60
Glucose: 108 mg/dL — ABNORMAL HIGH (ref 65–99)
Osmolality: 278 (ref 275–301)
Sodium: 139 mmol/L (ref 136–145)
Total Protein: 10 g/dL — ABNORMAL HIGH (ref 6.4–8.2)

## 2012-04-23 LAB — CBC
MCH: 27.6 pg (ref 26.0–34.0)
MCHC: 32.2 g/dL (ref 32.0–36.0)
MCV: 86 fL (ref 80–100)
RDW: 19.2 % — ABNORMAL HIGH (ref 11.5–14.5)

## 2012-04-23 LAB — URINALYSIS, COMPLETE
Glucose,UR: NEGATIVE mg/dL (ref 0–75)
Ketone: NEGATIVE
RBC,UR: 1 /HPF (ref 0–5)
Specific Gravity: 1.013 (ref 1.003–1.030)
Squamous Epithelial: <1

## 2012-04-23 LAB — DRUG SCREEN, URINE
Amphetamines, Ur Screen: NEGATIVE (ref ?–1000)
Methadone, Ur Screen: NEGATIVE (ref ?–300)
Opiate, Ur Screen: POSITIVE (ref ?–300)
Tricyclic, Ur Screen: NEGATIVE (ref ?–1000)

## 2012-04-23 LAB — ACETAMINOPHEN LEVEL: Acetaminophen: <2 ug/mL

## 2012-04-23 LAB — PROTIME-INR: INR: 1

## 2012-04-23 NOTE — ED Provider Notes (Signed)
MSE was initiated and I personally evaluated the patient and placed orders (if any) at  11:05 PM on April 23, 2012.  The patient appears stable so that the remainder of the MSE may be completed by another provider.  The patient had a full evaluation at [facility]. Patient was seen there for issues with suicidal ideation and altered mental status as well as arm pain that was found to be related to a humerus fracture. 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 Patient could not be admitted to the hospital there because they did not have neurosurgery available. ER physician there discussed the case with both the hospitalist and neurosurgery here at [facility]. They recommend the patient come to the emergency room to determine the best place for him to be admitted.  The patient is alert and awake at the bedside. He requests pain for his humerus fracture  Celene Kras, MD 04/23/12 2308

## 2012-04-23 NOTE — ED Notes (Signed)
Pt in from [facility], pt arrived escorted by [facility] PD d/t pt threatening to harm himself, pt A&O x4, follows commands, speaks in complete sentences, per Report given to Charge RN the pt has a humerus fracture with unknown onset & abnormal MRI results, pt presents to [facility] ED for eval & admission, per EMS report pt was A& x 4 in route with vitals WNL, pt calm & cooperative during transport

## 2012-04-24 ENCOUNTER — Encounter (HOSPITAL_COMMUNITY): Payer: Self-pay | Admitting: *Deleted

## 2012-04-24 ENCOUNTER — Inpatient Hospital Stay (HOSPITAL_COMMUNITY): Payer: Medicare Other

## 2012-04-24 DIAGNOSIS — R45851 Suicidal ideations: Secondary | ICD-10-CM

## 2012-04-24 DIAGNOSIS — K219 Gastro-esophageal reflux disease without esophagitis: Secondary | ICD-10-CM | POA: Diagnosis present

## 2012-04-24 DIAGNOSIS — E785 Hyperlipidemia, unspecified: Secondary | ICD-10-CM | POA: Diagnosis present

## 2012-04-24 DIAGNOSIS — T1491XA Suicide attempt, initial encounter: Secondary | ICD-10-CM | POA: Diagnosis present

## 2012-04-24 DIAGNOSIS — I10 Essential (primary) hypertension: Secondary | ICD-10-CM

## 2012-04-24 DIAGNOSIS — G9389 Other specified disorders of brain: Secondary | ICD-10-CM | POA: Diagnosis present

## 2012-04-24 DIAGNOSIS — S42309A Unspecified fracture of shaft of humerus, unspecified arm, initial encounter for closed fracture: Secondary | ICD-10-CM | POA: Diagnosis present

## 2012-04-24 DIAGNOSIS — F329 Major depressive disorder, single episode, unspecified: Secondary | ICD-10-CM | POA: Diagnosis present

## 2012-04-24 DIAGNOSIS — F32A Depression, unspecified: Secondary | ICD-10-CM | POA: Diagnosis present

## 2012-04-24 DIAGNOSIS — C719 Malignant neoplasm of brain, unspecified: Secondary | ICD-10-CM

## 2012-04-24 LAB — CBC
MCH: 27.2 pg (ref 26.0–34.0)
MCHC: 32.3 g/dL (ref 30.0–36.0)
Platelets: 356 10*3/uL (ref 150–400)
RDW: 17.4 % — ABNORMAL HIGH (ref 11.5–15.5)

## 2012-04-24 LAB — COMPREHENSIVE METABOLIC PANEL
ALT: 8 U/L (ref 0–53)
AST: 17 U/L (ref 0–37)
Alkaline Phosphatase: 144 U/L — ABNORMAL HIGH (ref 39–117)
CO2: 22 mEq/L (ref 19–32)
Chloride: 101 mEq/L (ref 96–112)
Creatinine, Ser: 0.45 mg/dL — ABNORMAL LOW (ref 0.50–1.35)
GFR calc non Af Amer: >90 mL/min (ref >90)
Total Bilirubin: 0.3 mg/dL (ref 0.3–1.2)

## 2012-04-24 LAB — VITAMIN B12: Vitamin B-12: 877 pg/mL (ref 211–911)

## 2012-04-24 LAB — TSH: TSH: 0.182 u[IU]/mL — ABNORMAL LOW (ref 0.350–4.500)

## 2012-04-24 MED ORDER — DEXAMETHASONE SODIUM PHOSPHATE 4 MG/ML IJ SOLN
4.0000 mg | Freq: Four times a day (QID) | INTRAMUSCULAR | Status: DC
  Administered 2012-04-24 – 2012-04-26 (×9): 4 mg via INTRAVENOUS
  Filled 2012-04-24 (×13): qty 1

## 2012-04-24 MED ORDER — ACYCLOVIR 400 MG PO TABS
400.0000 mg | ORAL_TABLET | Freq: Two times a day (BID) | ORAL | Status: DC
Start: 2012-04-24 — End: 2012-04-24
  Filled 2012-04-24: qty 1

## 2012-04-24 MED ORDER — AMLODIPINE BESYLATE 10 MG PO TABS
10.0000 mg | ORAL_TABLET | Freq: Every day | ORAL | Status: DC
  Administered 2012-04-24 – 2012-04-26 (×2): 10 mg via ORAL
  Filled 2012-04-24 (×3): qty 1

## 2012-04-24 MED ORDER — BENAZEPRIL HCL 40 MG PO TABS
40.0000 mg | ORAL_TABLET | Freq: Every day | ORAL | Status: DC
  Administered 2012-04-24 – 2012-04-26 (×3): 40 mg via ORAL
  Filled 2012-04-24 (×3): qty 1

## 2012-04-24 MED ORDER — ACYCLOVIR 200 MG PO CAPS
400.0000 mg | ORAL_CAPSULE | Freq: Two times a day (BID) | ORAL | Status: DC
  Administered 2012-04-24 – 2012-04-26 (×6): 400 mg via ORAL
  Filled 2012-04-24 (×6): qty 2

## 2012-04-24 MED ORDER — POTASSIUM CHLORIDE IN NACL 20-0.9 MEQ/L-% IV SOLN
INTRAVENOUS | Status: DC
  Administered 2012-04-24 – 2012-04-25 (×3): via INTRAVENOUS
  Filled 2012-04-24 (×6): qty 1000

## 2012-04-24 MED ORDER — SIMVASTATIN 20 MG PO TABS
20.0000 mg | ORAL_TABLET | Freq: Every day | ORAL | Status: DC
  Administered 2012-04-24 – 2012-04-26 (×3): 20 mg via ORAL
  Filled 2012-04-24 (×3): qty 1

## 2012-04-24 MED ORDER — SERTRALINE HCL 100 MG PO TABS
100.0000 mg | ORAL_TABLET | Freq: Every day | ORAL | Status: DC
  Administered 2012-04-24 – 2012-04-26 (×3): 100 mg via ORAL
  Filled 2012-04-24 (×3): qty 1

## 2012-04-24 MED ORDER — HYDRALAZINE HCL 20 MG/ML IJ SOLN
10.0000 mg | Freq: Four times a day (QID) | INTRAMUSCULAR | Status: DC | PRN
  Administered 2012-04-24 – 2012-04-26 (×2): 10 mg via INTRAVENOUS
  Filled 2012-04-24 (×2): qty 0.5

## 2012-04-24 MED ORDER — ACETAMINOPHEN 325 MG PO TABS
650.0000 mg | ORAL_TABLET | Freq: Four times a day (QID) | ORAL | Status: DC | PRN
  Administered 2012-04-24 – 2012-04-26 (×9): 650 mg via ORAL
  Filled 2012-04-24 (×11): qty 2

## 2012-04-24 MED ORDER — ONDANSETRON HCL 4 MG PO TABS: 4.0000 mg | ORAL_TABLET | Freq: Four times a day (QID) | ORAL | Status: DC | PRN

## 2012-04-24 MED ORDER — AMLODIPINE BESY-BENAZEPRIL HCL 10-40 MG PO CAPS: 1.0000 | ORAL_CAPSULE | Freq: Every day | ORAL | Status: DC

## 2012-04-24 MED ORDER — SODIUM CHLORIDE 0.9 % IJ SOLN
3.0000 mL | Freq: Two times a day (BID) | INTRAMUSCULAR | Status: DC
  Administered 2012-04-24 – 2012-04-25 (×4): 3 mL via INTRAVENOUS

## 2012-04-24 MED ORDER — ACETAMINOPHEN 650 MG RE SUPP: 650.0000 mg | Freq: Four times a day (QID) | RECTAL | Status: DC | PRN

## 2012-04-24 MED ORDER — ONDANSETRON HCL 4 MG/2ML IJ SOLN: 4.0000 mg | Freq: Four times a day (QID) | INTRAMUSCULAR | Status: DC | PRN

## 2012-04-24 MED ORDER — OXYCODONE HCL 5 MG PO TABS
5.0000 mg | ORAL_TABLET | ORAL | Status: DC | PRN
  Administered 2012-04-24 – 2012-04-25 (×9): 5 mg via ORAL
  Filled 2012-04-24 (×9): qty 1

## 2012-04-24 NOTE — ED Notes (Signed)
Sitter at bedside. Sitter to accompany pt to 4N.

## 2012-04-24 NOTE — H&P (Addendum)
[facility] History and Physical  BRANTLEE PENN GEX:[id number] DOB: 11/11/1945 DOA: 04/23/2012  Referring physician: Dr. Lynelle Doctor PCP: [name], MD  Specialists:   Chief Complaint: fall, arm pain  HPI: Alexander Wong is a 66 y.o. male who presents to the ED at [facility] with recurring falls, dizziness and worsening right arm pain.  He was evaluated in the ED with a thorough work up.  Unfortunately, blood tests and imaging studies have not transferred over in epic, but are available in the paper chart.  Patient reports that approximately 2 weeks ago, he had tripped over a rocking chair and landed on his side.  He had worsening pain in his right arm after the fall. He was taking pain medication for his pain, but when the pain continued to escalate, he came to the ED for evaluation.  He does report occasional blurry vision, feeling lightheaded and often losing his balance.  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 He denies any other complaints. In the ED he was found to have a right humerus fracture.  Basic labs including cbc and chemistries were found to be unremarkable.  Chest xray did not show any infiltrates and urinalysis was unremarkable.  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  This was also present on imaging from 2010, but has increased in size. Case was discussed by EDP with Dr. Danielle Dess on call for neurosurgery, who recommended a medical admission at [facility].   In addition to above findings, there were reports from patient's family that the patient had attempted to overdose on percocet pills 3 days ago. He had taken an unknown amount of pills. When discussing with the patient, he denies any suicidal ideations and reports taking extra pain medications due to pain in his arm.  Nevertheless, patient was placed on involuntary commitment papers and suicide precautions. It was also felt that the patient was delirious at [facility].    Review of Systems: all systems reviewed, pertinent positives stated in HPI  Past Medical History  Diagnosis Date  . Hypertension   . Hyperlipidemia   . Chronic back pain   . Chronic narcotic dependence   . Post-polio syndrome     w/ right sided weakness  . GERD (gastroesophageal reflux disease)   . 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 Cardiac catheterization 2013    @ [facility];  . Coronary angioplasty 2013    @ [facility]   Social History:  reports that he has quit smoking. He does not have any smokeless tobacco history on file. He reports that he does not drink alcohol. His drug history not on file. Lives with his elderly mother  Allergies  Allergen Reactions  . Erythromycin Hives    Allergy to entire class of drugs  . Penicillins Hives  . Medrol (Methylprednisolone) Rash    Family history: no reported history of brain tumors in the family  Prior to Admission medications   Medication Sig Start Date End Date Taking? Authorizing Provider  acetaminophen (TYLENOL) 500 MG tablet Take 1,000 mg by mouth every 6 (six) hours as needed. For pain   Yes Historical Provider, MD  acyclovir (ZOVIRAX) 400 MG tablet Take 400 mg by mouth 2 (two) times daily.   Yes Historical Provider, MD  ALPRAZolam Prudy Feeler) 0.5 MG tablet Take 0.125-1 mg by mouth 4 (four) times daily as needed. For anxiety  Yes Historical Provider, MD  amLODipine-benazepril (LOTREL) 10-40 MG per capsule Take 1 capsule by mouth daily.   Yes Historical Provider, MD  lovastatin (MEVACOR) 40 MG tablet Take 40 mg by mouth daily.    Yes Historical Provider, MD  Multiple Vitamin (MULTIVITAMIN WITH MINERALS) TABS Take 1 tablet by mouth daily.   Yes Historical Provider, MD  omeprazole (PRILOSEC) 40 MG capsule Take 40 mg by mouth daily.   Yes Historical Provider, MD  sertraline (ZOLOFT) 100 MG tablet Take 100 mg by mouth daily.   Yes Historical Provider, MD   Physical  Exam: Filed Vitals:   04/23/12 2248 04/24/12 0100 04/24/12 0105  BP: 182/113 182/100 194/98  Pulse: 86 71   Temp: 97.7 F (36.5 C) 97.9 F (36.6 C)   TempSrc: Oral Oral   Resp: 18 18   Height:  6\' 1"  (1.854 m)   Weight:  72.4 kg (159 lb 9.8 oz)   SpO2: 96% 95%      General:  NAD  Eyes: PERRLA  ENT: mucous membranes dry, no pharyngeal erythema  Neck: supple  Cardiovascular: s1, s2, rrr  Respiratory: cta b  Abdomen: soft, nt, bs+  Skin: normal  Musculoskeletal: right upper extremity in sling, is atrophic and flaccid  Psychiatric: denies any depression, suicidal ideations, admits to anxiety  Neurologic: 4/5 in LLE, 3/5 in RLE (chronic) 5/5 in LUE, RUE 0/5 (chronic)  Labs on Admission:  Basic Metabolic Panel: No results found for this basename: NA:5,K:5,CL:5,CO2:5,GLUCOSE:5,BUN:5,CREATININE:5,CALCIUM:5,MG:5,PHOS:5 in the last 168 hours Liver Function Tests: No results found for this basename: AST:5,ALT:5,ALKPHOS:5,BILITOT:5,PROT:5,ALBUMIN:5 in the last 168 hours No results found for this basename: LIPASE:5,AMYLASE:5 in the last 168 hours No results found for this basename: AMMONIA:5 in the last 168 hours CBC: No results found for this basename: WBC:5,NEUTROABS:5,HGB:5,HCT:5,MCV:5,PLT:5 in the last 168 hours Cardiac Enzymes: No results found for this basename: CKTOTAL:5,CKMB:5,CKMBINDEX:5,TROPONINI:5 in the last 168 hours  BNP (last 3 results) No results found for this basename: PROBNP:3 in the last 8760 hours CBG: No results found for this basename: GLUCAP:5 in the last 168 hours  Radiological Exams on Admission: No results found.    Assessment/Plan Active Problems:  Suicide attempt  Brain mass  Humerus fracture  Depression  Hypertension  GERD (gastroesophageal reflux disease)  Hyperlipidemia   1. Suicide attempt.  Although patient is denying suicidal ideations, with his history of anxiety and depression, and involuntary commitment papers signed at  [facility], will request psychiatry consultation to further evaluate him. Continue sitter for now 2. 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  Dr. Danielle Dess consulted by EDP at [facility]  Will place the patient on decadron for now with frequent neuro checks. 3. Humerus fracture.  I have consulted orthopedics, Dr. Rennis Chris will see the patient in the morning. 4. Delirium. May be due to medications.  He is speaking appropriately at this time and does not appear to be confused.  Will have to re assess in the morning, although this appears to have resolved. Will hold xanax for now.  Will need to be cautious with pain medications. 5. HTN. Will continue outpatient meds and use prn hydralazine. 6. HLP.  Continue outpatient meds. 7. dvt prophylaxis.  Use SCDs for now in case any surgical intervention is planned 8. Frequent falls.  Possibly related to #2.  Will check b12, rpr, tsh.  Physical therapy to see.    Code Status: full code Family Communication: discussed with patient  Disposition Plan: depending on hospital course Time spent:  Loretto Belinsky [facility] Pager [phone]  If 7PM-7AM, please contact night-coverage [email or web address] Password [email or web address] 04/24/2012, 1:37 AM

## 2012-04-24 NOTE — Consult Note (Signed)
Patient Identification:  Alexander Wong Date of Evaluation:  04/24/2012 Reason for Consult:Suicidal ideation  Overdose  Referring Provider: DrCena Benton  History of Present Illness:pt has been making threats to kill himself.  He took 3 percocets a few days ago.  He denies SI today. He said he was sleeping and the preacher came to see his mother.  He called 911 and wa brought to [facility] ED  He says when he woke up he had blurry vision.  He says he had a bit earlier, he had gone to the porch at early evening.  He tripped over one of the rocker runners, fell and broke his R humerus.  Past Psychiatric History:Pt claims anxiety.  He says he has ~ 400-500 pills of Xanax in his safe.  He "only takes 1/4 tab a day".  He says he drank alcohol and stopped in 1991 and began at age 66.  He went to a treatment center.   Past Medical History:     Past Medical History  Diagnosis Date  . Hypertension   . Hyperlipidemia   . Chronic back pain   . Chronic narcotic dependence   . Post-polio syndrome     w/ right sided weakness  . GERD (gastroesophageal reflux disease)   . 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 Cardiac catheterization 2013    @ [facility];  . Coronary angioplasty 2013    @ [facility]    Allergies:  Allergies  Allergen Reactions  . Erythromycin Hives    Allergy to entire class of drugs  . Penicillins Hives  . Medrol (Methylprednisolone) Rash    Current Medications:  Prior to Admission medications   Medication Sig Start Date End Date Taking? Authorizing Provider  acetaminophen (TYLENOL) 500 MG tablet Take 1,000 mg by mouth every 6 (six) hours as needed. For pain   Yes Historical Provider, MD  acyclovir (ZOVIRAX) 400 MG tablet Take 400 mg by mouth 2 (two) times daily.   Yes Historical Provider, MD  ALPRAZolam Prudy Feeler) 0.5 MG tablet Take 0.125-1 mg by mouth 4 (four) times daily as needed. For anxiety   Yes Historical Provider, MD    amLODipine-benazepril (LOTREL) 10-40 MG per capsule Take 1 capsule by mouth daily.   Yes Historical Provider, MD  lovastatin (MEVACOR) 40 MG tablet Take 40 mg by mouth daily.    Yes Historical Provider, MD  Multiple Vitamin (MULTIVITAMIN WITH MINERALS) TABS Take 1 tablet by mouth daily.   Yes Historical Provider, MD  omeprazole (PRILOSEC) 40 MG capsule Take 40 mg by mouth daily.   Yes Historical Provider, MD  sertraline (ZOLOFT) 100 MG tablet Take 100 mg by mouth daily.   Yes Historical Provider, MD    Social History:    reports that he has quit smoking. He does not have any smokeless tobacco history on file. He reports that he does not drink alcohol. His drug history not on file.   Family History:    History reviewed. No pertinent family history.  Mental Status Examination/Evaluation: Objective:  Appearance: Casual  Eye Contact::  Good  Speech:  Clear and Coherent  Volume:  Normal  Mood:  jovial  Affect:  Appropriate and Congruent  Thought Process:  Coherent and Goal Directed  Orientation:  Full  Thought Content:  Suicidal ideation, likes to collect arrowheads and sell them   Suicidal Thoughts:  No  Homicidal Thoughts:  No  Judgement:  Fair  Insight:  Fair     DIAGNOSIS:   AXIS I   chronic depression with overdose; alcohol abuse, benzodiazepine-dependent   AXIS II  Deffered  AXIS III See medical notes.  AXIS IV other psychosocial or environmental problems, problems related to social environment, problems with primary support group and ; mother with whom he lives may provide further information he is unwilling to disclose  AXIS V 51-60 moderate symptoms   Assessment/Plan:  Discussed with Dr. Cena Benton Patient is awake and where he is eager to talk about his arrowheads collections. 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 He says he takes Xanax for anxiety and takes only one quarter of a 1 mg  tablet and has stockpiled many pills in his safe. He is asked why he hasn't told his doctor that he only takes a quarter of a pill which could be prescribed in that manner and he abates the question. He states he's going to take all of the Xanax he has collected and to the pharmacy for disposal. His suicidal behavior is very vague about his mother was concerned enough to tell the preacher who found him sleeping. He minimizes the event saying that he was merely sleeping. He also described the accident which caused his upper arm to break. He seems generally and concerned about that saying that he has broken it several times. (This may indicate times that he has been intoxicated and fell although not confirmed)   His mental status exam states that he says it is Tuesday, 04/24/2012 he names the president as a bomb a period he begins with serial 100 subtracting 793, 86, 79, 72 without stopping. He is able to remember 3 of 3 objects. He denies suicidal thoughts now, denies auditory or visual hallucinations. He has no homicidal thoughts. He has had one DUI in 1986. He said [place] and [place] and every bar that was on the on the beach. It was enough to convince him to stop drinking.   This patient has organized, historical thought and is suspected of minimizing his suicide attempt although he toxicology screen was not ordered. His manner taking Xanax is a bit bizarre and it is not known if he's taking more and caused his fall. The fact that he has so many Xanax word is a concern it if he has been attempting suicide or threatening. It would be important for the mother to supervise disposal of so many pills. RECOMMENDATION:  1. Patient demonstrates with mental status exam he has capacity to understand the general nature of his condition and has capacity to take care of his routine care. 2.  Patient has a hobby, collecting arrowheads, and seems to take pleasure in this project since retirement. 3.   The patient is wanting to be discharged, his arm has been attended to and he has no active reference or thoughts about suicide. 4.  Tends to contact Dr. Gwendalyn Ege shifted to Dr. Cena Benton to report patient has capacity to leave. Laurieanne Galloway J. Ferol Luz, MD Psychiatrist 04/24/2012  9:41 PM

## 2012-04-24 NOTE — Progress Notes (Signed)
Patient seen and admitted earlier this Am by my associate.  Please refer to his H and P for further details.  Ortho on board and currently evaluated patient.  Will reassess next am  Alexander Wong, [name]

## 2012-04-24 NOTE — Progress Notes (Addendum)
Physical Therapy Evaluation Note  Past Medical History  Diagnosis Date  . Hypertension   . Hyperlipidemia   . Chronic back pain   . Chronic narcotic dependence   . Post-polio syndrome     w/ right sided weakness  . GERD (gastroesophageal reflux disease)   . 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 Cardiac catheterization 2013    @ [facility];  . Coronary angioplasty 2013    @ [facility]     04/24/12 1117  PT Visit Information  Last PT Received On 04/24/12  Assistance Needed +2 (safety and lines)  PT Time Calculation  PT Start Time 1117, end time: 11:40,  23 minutes  Subjective Data  Subjective Pt received sitting eob with sitter with request to use bathroom  Precautions  Precautions Fall  Required Braces or Orthoses Other Brace/Splint (sling for R UE)  Restrictions  Weight Bearing Restrictions (need R UE clarification)  Home Living  Lives With (elderly mother)  Available Help at Discharge Family  Type of Home House  Home Access Stairs to enter  Entrance Stairs-Number of Steps 4  Entrance Stairs-Rails Right  Home Layout One level  Bathroom Nurse, children's Yes  How Accessible (cane)  Home Adaptive Equipment Bedside commode/3-in-1;Shower chair with back;Straight cane  Prior Function  Level of Independence Independent  Able to Take Stairs? Yes  Driving Yes  Vocation On disability  Communication  Communication No difficulties  Cognition  Overall Cognitive Status Appears within functional limits for tasks assessed/performed  Arousal/Alertness Awake/alert  Orientation Level Appears intact for tasks assessed  Behavior During Session [facility] for tasks performed  Right Upper Extremity Assessment  RUE ROM/Strength/Tone Deficits (humerus fx, hemiparesis from polio)  Left Upper Extremity Assessment  LUE ROM/Strength/Tone Deficits  LUE ROM/Strength/Tone  Deficits shld strength 2/5 due to polio  Right Lower Extremity Assessment  RLE ROM/Strength/Tone Deficits (drop foot, grossly 3/5)  Left Lower Extremity Assessment  LLE ROM/Strength/Tone WFL  Trunk Assessment  Trunk Assessment Normal  Bed Mobility  Bed Mobility Supine to Sit  Supine to Sit 4: Min assist;HOB flat  Details for Bed Mobility Assistance increased time, jerk like activity  Transfers  Transfers Sit to Stand;Stand to Sit  Sit to Stand 3: Mod assist;With upper extremity assist;From bed  Stand to Sit 3: Mod assist;To chair/3-in-1 (to control descent to commode)  Details for Transfer Assistance pt with strong use of L UE due to inability to use R UE. pt required multiple attempts to achieve standing. Patient minA to maintain balance to perform hygiene post BM.  Ambulation/Gait  Ambulation/Gait Assistance 3: Mod assist  Ambulation Distance (Feet) 75 Feet  Assistive device 1 person hand held assist  Ambulation/Gait Assistance Details pt unable to use RW due to decrease R UE function.  Gait Pattern Decreased stance time - right;Decreased step length - right;Step-to pattern;Right steppage  Gait velocity slow  General Gait Details pt very unsteady compared to PTA. pt requires use of manual assist to maintain balance to prevent fall  Stairs No  Balance  Balance Assessed Yes  Static Standing Balance  Static Standing - Balance Support No upper extremity supported  Static Standing - Level of Assistance 3: Mod assist  PT - End of Session  Equipment Utilized During Treatment Gait belt  Activity Tolerance Patient tolerated treatment well  Patient left in chair;with call bell/phone within reach (sitter present)  Nurse Communication Mobility status  PT Assessment  Clinical Impression Statement Pt 66 yo male s/p multiple falls at home presenting with R humerus fx. Patient c/o of blurred vision causing his falls. Patient I PTA now requires modA for all oob mobility to maintain balance  and complete safe function. Patient reports to have 24/7 assist at home via family. If 24/7 supervision/assist can not be provided patient to benefit from SNF upon d/c to return to I function prior to return home.  Patient to benefit from HHPT if patient has 24/7 assist.  PT Recommendation/Assessment Patient needs continued PT services  PT Problem List Decreased strength;Decreased balance;Decreased mobility  PT Therapy Diagnosis  Difficulty walking  PT Plan  PT Frequency Min 5X/week  PT Treatment/Interventions Gait training;Functional mobility training;Therapeutic exercise;Stair training  PT Recommendation  Follow Up Recommendations Home health PT;Supervision/Assistance - 24 hour (or SNF if patient doesn't have 24/7 supervision avail)  Individuals Consulted  Consulted and Agree with Results and Recommendations Patient  Acute Rehab PT Goals  PT Goal Formulation With patient  Time For Goal Achievement 05/08/12  Potential to Achieve Goals Good  Pt will go Supine/Side to Sit with modified independence;with HOB 0 degrees  PT Goal: Supine/Side to Sit - Progress Goal set today  Pt will go Sit to Stand with supervision  PT Goal: Sit to Stand - Progress Goal set today  Pt will Ambulate >150 feet;with supervision;with least restrictive assistive device  PT Goal: Ambulate - Progress Goal set today  Pt will Go Up / Down Stairs 3-5 stairs;with rail(s);with supervision  PT Goal: Up/Down Stairs - Progress Goal set today  PT General Charges  $$ ACUTE PT VISIT 1 Procedure  PT Evaluation  $Initial PT Evaluation Tier I 1 Procedure  PT Treatments  $Therapeutic Activity 8-22 mins  Written Expression  Dominant Hand Right    Pain: 7/10 R shld pain  Lewis Shock, PT, DPT Pager #: [phone] Office #: [phone]

## 2012-04-24 NOTE — Progress Notes (Signed)
Test paged Dr. Cena Benton, informing him of pts request for more pain medication.

## 2012-04-24 NOTE — Consult Note (Signed)
Reason for Consult:right humerus fracture Referring Physician: [facility]  HPI: Alexander Wong is an 66 y.o. male who presents to the ED at [facility] with recurring falls, dizziness and worsening right arm pain. He was evaluated in the ED with a thorough work up. Unfortunately, blood tests and imaging studies have not transferred over in epic, but are available in the paper chart. Patient reports that approximately 2 weeks ago, he had tripped over a rocking chair and landed on his side. He had worsening pain in his right arm after the fall. He was taking pain medication for his pain, but when the pain continued to escalate, he came to the ED for evaluation. He does report occasional blurry vision, feeling lightheaded and often losing his balance. 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 He denies any other complaints. In the ED he was found to have a right humerus fracture. Basic labs including cbc and chemistries were found to be unremarkable. Chest xray did not show any infiltrates and urinalysis was unremarkable. 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 This was also present on imaging from 2010, but has increased in size. Case was discussed by EDP with Dr. Danielle Dess on call for neurosurgery, who recommended a medical admission at [facility].   Patient reports no functional use of RUE since age 11 following polio. Also reports chronic left shoulder weakness, progressively increased with an inability to reach left arm overhead for several years. Reports good leg function over the years  Past Medical History  Diagnosis Date  . Hypertension   . Hyperlipidemia   . Chronic back pain   . Chronic narcotic dependence   . Post-polio syndrome     w/ right sided weakness  . GERD (gastroesophageal reflux disease)   . 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 Cardiac catheterization 2013    @ [facility];  .  Coronary angioplasty 2013    @ [facility]    History reviewed. No pertinent family history.  Social History:  reports that he has quit smoking. He does not have any smokeless tobacco history on file. He reports that he does not drink alcohol. His drug history not on file.  Allergies:  Allergies  Allergen Reactions  . Erythromycin Hives    Allergy to entire class of drugs  . Penicillins Hives  . Medrol (Methylprednisolone) Rash    Medications: I have reviewed the patient's current medications.  Results for orders placed during the hospital encounter of 04/23/12 (from the past 48 hour(s))  TSH     Status: Abnormal   Collection Time   04/24/12  2:54 AM      Component Value Range Comment   TSH 0.182 (*) 0.350 - 4.500 uIU/mL   VITAMIN B12     Status: Normal   Collection Time   04/24/12  2:54 AM      Component Value Range Comment   Vitamin B-12 877  211 - 911 pg/mL   RPR     Status: Normal   Collection Time   04/24/12  2:54 AM      Component Value Range Comment   RPR NON REACTIVE  NON REACTIVE   COMPREHENSIVE METABOLIC PANEL     Status: Abnormal   Collection Time   04/24/12  2:55 AM      Component Value Range Comment   Sodium 138  135 - 145 mEq/L  Potassium 3.6  3.5 - 5.1 mEq/L    Chloride 101  96 - 112 mEq/L    CO2 22  19 - 32 mEq/L    Glucose, Bld 129 (*) 70 - 99 mg/dL    BUN 15  6 - 23 mg/dL    Creatinine, Ser 1.61 (*) 0.50 - 1.35 mg/dL    Calcium 9.7  8.4 - 09.6 mg/dL    Total Protein 8.4 (*) 6.0 - 8.3 g/dL    Albumin 3.9  3.5 - 5.2 g/dL    AST 17  0 - 37 U/L    ALT 8  0 - 53 U/L    Alkaline Phosphatase 144 (*) 39 - 117 U/L    Total Bilirubin 0.3  0.3 - 1.2 mg/dL    GFR calc non Af Amer >90  >90 mL/min    GFR calc Af Amer >90  >90 mL/min   CBC     Status: Abnormal   Collection Time   04/24/12  2:55 AM      Component Value Range Comment   WBC 6.8  4.0 - 10.5 K/uL    RBC 4.85  4.22 - 5.81 MIL/uL    Hemoglobin 13.2  13.0 - 17.0 g/dL    HCT 04.5  40.9 - 81.1 %    MCV  84.3  78.0 - 100.0 fL    MCH 27.2  26.0 - 34.0 pg    MCHC 32.3  30.0 - 36.0 g/dL    RDW 91.4 (*) 78.2 - 15.5 %    Platelets 356  150 - 400 K/uL     No results found.   Vitals Temp:  [97.7 F (36.5 C)-97.9 F (36.6 C)] 97.9 F (36.6 C) (11/05 0656) Pulse Rate:  [71-124] 117  (11/05 0656) Resp:  [18] 18  (11/05 0100) BP: (155-194)/(80-113) 155/105 mmHg (11/05 0656) SpO2:  [95 %-98 %] 98 % (11/05 0656) Weight:  [72.4 kg (159 lb 9.8 oz)] 72.4 kg (159 lb 9.8 oz) (11/05 0100) Body mass index is 21.06 kg/(m^2).  Physical Exam: slender, cooperative WM, c/o R arm pain. A and O. RUE in coaptation splint. Evidence of chronic atrophy and contractures elbow, wrist, digits. 2+ pulse. L UE with profound deltoid weakness, good motion and strength wrist and hand. BLE with functional motion and strength     Assessment/Plan: Impression:  Suicide attempt  Brain mass  Humerus fracture  Depression  Hypertension  GERD (gastroesophageal reflux disease)  Hyperlipidemia recurrent falls Treatment: no films currently available and none taken since placement of splint to RUE. Will obtain repeat films to help with treatment planning. I am hopeful this fracture will be amenable to conservative management.  Olanda Boughner M 04/24/2012, 1:20 PM

## 2012-04-24 NOTE — Progress Notes (Signed)
CSW met with patient today, conversation included discussion of patient's current stressors and reported suicidal ideation.  Patient reports he has not nor has he never been suicidal.  He is looking forward to celebrating his 66th birthday later this month.  He also reports pastor may have taken a comment he made more seriously than he intended and feels pastor overreacted. Full assessment to follow tomorrow.  Clide Dales 04/24/2012 4:58 PM

## 2012-04-25 DIAGNOSIS — F1320 Sedative, hypnotic or anxiolytic dependence, uncomplicated: Secondary | ICD-10-CM

## 2012-04-25 DIAGNOSIS — C719 Malignant neoplasm of brain, unspecified: Secondary | ICD-10-CM

## 2012-04-25 DIAGNOSIS — F1010 Alcohol abuse, uncomplicated: Secondary | ICD-10-CM

## 2012-04-25 DIAGNOSIS — T50901A Poisoning by unspecified drugs, medicaments and biological substances, accidental (unintentional), initial encounter: Secondary | ICD-10-CM

## 2012-04-25 DIAGNOSIS — T50904A Poisoning by unspecified drugs, medicaments and biological substances, undetermined, initial encounter: Secondary | ICD-10-CM

## 2012-04-25 DIAGNOSIS — I10 Essential (primary) hypertension: Secondary | ICD-10-CM

## 2012-04-25 LAB — URINE CULTURE

## 2012-04-25 MED ORDER — OXYCODONE HCL 5 MG PO TABS
10.0000 mg | ORAL_TABLET | ORAL | Status: DC | PRN
  Administered 2012-04-25 – 2012-04-26 (×7): 10 mg via ORAL
  Filled 2012-04-25 (×7): qty 2

## 2012-04-25 NOTE — Progress Notes (Signed)
Physical Therapy Treatment Note   04/25/12 1431  PT Visit Information  Last PT Received On 04/25/12  Assistance Needed +2 (for lines)  PT Time Calculation  PT Start Time 1431  PT Stop Time 1446  PT Time Calculation (min) 15 min  Subjective Data  Subjective Pt received sitting up in bed with c/o 9/10 back pain but agreeable to PT.  Precautions  Precautions Fall  Required Braces or Orthoses (sling)  Cognition  Overall Cognitive Status Appears within functional limits for tasks assessed/performed  Arousal/Alertness Awake/alert  Orientation Level Appears intact for tasks assessed  Behavior During Session [facility] for tasks performed  Bed Mobility  Bed Mobility Supine to Sit  Supine to Sit 5: Supervision;HOB elevated (HOB elevated at 90 deg)  Transfers  Transfers Sit to Stand;Stand to Sit  Sit to Stand 4: Min assist;With upper extremity assist;From bed  Stand to Sit 4: Min assist;With armrests;To chair/3-in-1  Details for Transfer Assistance pt impulsive and requires to stand for "30 seconds" prior to ambulating   Ambulation/Gait  Ambulation/Gait Assistance 4: Min assist  Ambulation Distance (Feet) 200 Feet  Assistive device None  Ambulation/Gait Assistance Details guarded patient at hips to assist with appropriate kinematics  due to scissored gait and R drop foot. Pt with 2 episodes of LOB requiring minA to regain balance.  Gait Pattern Decreased stance time - right;Decreased step length - right;Step-to pattern;Right steppage  Stairs No  PT - End of Session  Equipment Utilized During Treatment Gait belt  Activity Tolerance Patient tolerated treatment well  Patient left in chair;with call bell/phone within reach  Nurse Communication Mobility status  PT - Assessment/Plan  Comments on Treatment Session Pt with improved ambulation tolerance however patient reports not be back to normal. Pt con't to have c/o of 9/10 back and R arm pain. Pt con't to require 24/7 assist/supervision at home  to decrease fall risk.  PT Plan Discharge plan remains appropriate  PT Frequency Min 3X/week  Follow Up Recommendations Home health PT;Supervision/Assistance - 24 hour  Acute Rehab PT Goals  PT Goal: Supine/Side to Sit - Progress Progressing toward goal  PT Goal: Sit to Stand - Progress Progressing toward goal  PT Goal: Ambulate - Progress Progressing toward goal  PT General Charges  $$ ACUTE PT VISIT 1 Procedure  PT Treatments  $Gait Training 8-22 mins     Lewis Shock, PT, DPT Pager #: [phone] Office #: [phone]

## 2012-04-25 NOTE — Progress Notes (Signed)
Subjective: Patient seen and examined, still complains of pain, wants the dose of oxycodone to be increased. Neurosurgery has been consulted, [name] to see the patient today. Dr Rennis Chris has seen the patient, and cleared for discharge from the ortho standpoint.  Objective: Vital signs in last 24 hours: Temp:  [98 F (36.7 C)-98.5 F (36.9 C)] 98.5 F (36.9 C) (11/06 1400) Pulse Rate:  [81-102] 90  (11/06 1400) Resp:  [18] 18  (11/06 1400) BP: (140-174)/(74-88) 140/88 mmHg (11/06 1400) SpO2:  [98 %-100 %] 100 % (11/06 1400) Weight change:  Last BM Date: 04/24/12  Consults: Neurosurgery Psychiatry      Intake/Output from previous day: 11/05 0701 - 11/06 0700 In: 120 [P.O.:120] Out: -  Total I/O In: 484 [P.O.:484] Out: -    Physical Exam: Head: Normocephalic, atraumatic.  Eyes: No signs of jaundice, EOMI Nose: Mucous membranes dry.   Neck: supple,No deformities, masses, or tenderness noted. Lungs: Normal respiratory effort. B/L Clear to auscultation, no crackles or wheezes.  Heart: Regular RR. S1 and S2 normal  Abdomen: BS normoactive. Soft, Nondistended, non-tender.  Extremities: No edema   Lab Results: Basic Metabolic Panel:  Basename 04/24/12 0255  NA 138  K 3.6  CL 101  CO2 22  GLUCOSE 129*  BUN 15  CREATININE 0.45*  CALCIUM 9.7  MG --  PHOS --   Liver Function Tests:  Basename 04/24/12 0255  AST 17  ALT 8  ALKPHOS 144*  BILITOT 0.3  PROT 8.4*  ALBUMIN 3.9   No results found for this basename: LIPASE:2,AMYLASE:2 in the last 72 hours No results found for this basename: AMMONIA:2 in the last 72 hours CBC:  Basename 04/24/12 0255  WBC 6.8  NEUTROABS --  HGB 13.2  HCT 40.9  MCV 84.3  PLT 356   Thyroid Function Tests:  Basename 04/24/12 0254  TSH 0.182*  T4TOTAL --  FREET4 --  T3FREE --  THYROIDAB --   Anemia Panel:  Basename 04/24/12 0254  VITAMINB12 877  FOLATE --  FERRITIN --  TIBC --  IRON --  RETICCTPCT --    Coagulation: No results found for this basename: LABPROT:2,INR:2 in the last 72 hours Urine Drug Screen: Drugs of Abuse  No results found for this basename: labopia, cocainscrnur, labbenz, amphetmu, thcu, labbarb     No results found for this or any previous visit (from the past 240 hour(s)).  Studies/Results: Dg Humerus Right  04/24/2012  *RADIOLOGY REPORT*  Clinical Data: Right humeral fracture noted on prior MRI.  RIGHT HUMERUS - 2+ VIEW  Comparison: None.  Findings: Fractures of the right humeral neck and midshaft. The humeral neck fractures appear well defined.  The mid shaft fracture margins are indistinct, suggesting this may be subacute.  There is mild anterior displacement of mid shaft fracture. There is an associated underlying lucency and pathologic fracture is not excluded. Diffuse osteopenia. Osseous irregularity of the distal fibula, in keeping with remote fracture. Visualized portion of the right lung is clear.  IMPRESSION: Fractures of the right humeral neck and mid shaft.  Mid shaft fracture may be pathologic. Is there an underlying malignancy?  Per report, the patient has a prior MRI.  Recommend comparison with those images.   Original Report Authenticated By: Jearld Lesch, M.D.     Medications: Scheduled Meds:   . acyclovir  400 mg Oral BID  . amLODipine  10 mg Oral Daily   And  . benazepril  40 mg Oral Daily  . dexamethasone  4 mg Intravenous Q6H  . sertraline  100 mg Oral Daily  . simvastatin  20 mg Oral q1800  . sodium chloride  3 mL Intravenous Q12H   Continuous Infusions:   . 0.9 % NaCl with KCl 20 mEq / L 75 mL/hr at 04/24/12 2025   PRN Meds:.acetaminophen, acetaminophen, hydrALAZINE, ondansetron (ZOFRAN) IV, ondansetron, oxyCODONE, [DISCONTINUED] oxyCODONE    Active Problems:  Suicide attempt  Brain mass  Humerus fracture  Depression  Hypertension  GERD (gastroesophageal reflux disease)  Hyperlipidemia    LOS: 2 days   Assessment/Plan:  Humerus fracture Ortho recommends to continue on splint and follow up as oatpatient in one month with Dr Rennis Chris.  Brainstem mass Neurosurgery to see the patient today.  ? Suicidal ideation Patient denies suicidal ideation. Psych following.Patient has xanax pills at home, will need [facility] Nurse follow at discharge.  HTN.  Will continue outpatient meds and use prn hydralazine.   Hyperlipidemia . Continue outpatient meds.  DVT prophylaxis SCD's  [facility] Pager: [phone] 04/25/2012, 6:19 PM

## 2012-04-25 NOTE — Progress Notes (Signed)
Clinical Social Work Department CLINICAL SOCIAL WORK PSYCHIATRY SERVICE LINE ASSESSMENT 04/25/2012  Patient:  Alexander Wong  Account:  [id number]  Admit Date:  04/23/2012  Clinical Social Worker:  Ronda Fairly, CLINICAL SOCIAL WORKER  Date/Time:  04/24/2012 03:00 PM Referred by:  Physician  Date referred:  04/24/2012 Reason for Referral  Behavioral Health Issues   Presenting Symptoms/Problems (In the person's/family's own words):   Patient reports he has not nor has he never been suicidal. He also reports his pastor may have taken a comment he made in preceding days more seriously than he intended and feels pastor overreacted when pastor was unable to awake him from a deep sleep.   Abuse/Neglect/Trauma History (check all that apply)  Denies history   Abuse/Neglect/Trauma Comments:   Psychiatric History (check all that apply)  Outpatient treatment   Psychiatric medications:  Zoloft   Current Mental Health Hospitalizations/Previous Mental Health History:   Denies history   Current provider:   Dr Sherrie Mustache prescribes antidepressant   Place and Date:   Sees Dr Sherrie Mustache, PCP, regularly   Current Medications:   acetaminophen, acetaminophen, hydrALAZINE, ondansetron (ZOFRAN) IV, ondansetron, oxyCODONE            . acyclovir  400 mg Oral BID  . amLODipine  10 mg Oral Daily   And     . benazepril  40 mg Oral Daily  . dexamethasone  4 mg Intravenous Q6H  . sertraline  100 mg Oral Daily  . simvastatin  20 mg Oral q1800  . sodium chloride  3 mL Intravenous Q12H   Previous Impatient Admission/Date/Reason:   NA   Emotional Health / Current Symptoms    Suicide/Self Harm  None reported   Suicide attempt in the past:   Other harmful behavior:   Psychotic/Dissociative Symptoms  None reported   Other Psychotic/Dissociative Symptoms:    Attention/Behavioral Symptoms  Within Normal Limits   Other Attention / Behavioral Symptoms:    Cognitive Impairment  Within Normal  Limits   Other Cognitive Impairment:    Mood and Adjustment  DEPRESSION    Stress, Anxiety, Trauma, Any Recent Loss/Stressor  None reported  Other - See comment   Anxiety (frequency):   Phobia (specify):   Compulsive behavior (specify):   Obsessive behavior (specify):   Other:   Patient has had e major spine surgeries and is looking at another.  Patient reports he has a "tumor at base of my head that sometimes causes my vision to blur." Patient has lived with his 44 YO mother for last 15 years and basically takes care of her.  He receives SSDI and reports he is ok financially.  He reports he started receiving Disability in 1992 for his post polio condition. His reports he fell 2.5 weeks prior and injured his right arm and was nervously awaiting x rays after our conversation.   Substance Abuse/Use  History of substance use   SBIRT completed (please refer for detailed history):  Y  Self-reported substance use:   Patient reports he used to drink quiet heavily while living at [facility] but stopped after receiving DUI in late 1980s   Urinary Drug Screen Completed:  N Alcohol level:    Environmental/Housing/Living Arrangement  With Family Member   Who is in the home:   Mother Alexander Wong   Emergency contact:  Alexander Wong at [phone]   Financial  Social Security Disability Income   Patient's Strengths and Goals (patient's own words):   Patient reports he has not  nor has he never been suicidal. He is looking forward to celebrating his 66 th birthday later this month. His hobbies include coin collecting and arrowhead hunting.   Clinical Social Worker's Interpretive Summary:   Patient has been prescribed pain medication  (percocet 30mg  every 4 hours) for last 15 years and reports he does not abuse them.  He also is prescribed Xanax for sleep and usually takes 1/4 to 1/2 tab, although he did self medicate after injuring his arm.  Pt reports he took three xanax before pastor had  difficulty waking him.  Patient realizes he should have sought medical care for injured arm verses self medicating.  Patient denies SI, HI, AH and VH.  Patient wishes to discharge and has family support in the home.   Disposition:  Psych Clinical Social Worker signing off

## 2012-04-25 NOTE — Progress Notes (Signed)
Chaplain visited with pt while rounding in 4N. Pt mentioned that right arm (which he broke in recent fall) has been useless to him since he had polio at age 66. Pt states he was a heavy drinker for some years but went through alcohol rehab program in [place] and has been sober since then. 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 Pt knows he needs surgery to deal with this and states a neurosurgeon will be coming this evening to consult. Pt knows this is serious situation but does not appear anxious at all. Pt is strong in his [place] faith and welcomed prayer. Pt said he enjoyed visiting with chaplain.

## 2012-04-25 NOTE — Progress Notes (Signed)
Progress Note s/p consultation Pt is receiving evaluation ortho and surgery.  CT of R arm suggests possible pathologic fracture.  Suspects association with Branstem mass.    Patient Identification:  Alexander Wong Date of Evaluation:  04/25/2012   History of Present Illness:Pt found sleeping after he took  3 pills percocet 3 days prior.  Mothe was concerned and asked pastor who was visiting to check on pt.  He was sleeping and confused as to why he was being sent to the ED.     Past Psychiatric History Pt stopped drinking alcohol in 1991 went to a treatment center.  He says he always suffered from severe anxiety and was given Xanax - gets 1 mg and takes only 1/4 tab daily - he says.   Past Medical History:     Past Medical History  Diagnosis Date  . Hypertension   . Hyperlipidemia   . Chronic back pain   . Chronic narcotic dependence   . Post-polio syndrome     w/ right sided weakness  . GERD (gastroesophageal reflux disease)   . 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 Cardiac catheterization 2013    @ [facility];  . Coronary angioplasty 2013    @ [facility]    Allergies:  Allergies  Allergen Reactions  . Erythromycin Hives    Allergy to entire class of drugs  . Penicillins Hives  . Medrol (Methylprednisolone) Rash    Current Medications:  Prior to Admission medications   Medication Sig Start Date End Date Taking? Authorizing Provider  acetaminophen (TYLENOL) 500 MG tablet Take 1,000 mg by mouth every 6 (six) hours as needed. For pain   Yes Historical Provider, MD  acyclovir (ZOVIRAX) 400 MG tablet Take 400 mg by mouth 2 (two) times daily.   Yes Historical Provider, MD  ALPRAZolam Prudy Feeler) 0.5 MG tablet Take 0.125-1 mg by mouth 4 (four) times daily as needed. For anxiety   Yes Historical Provider, MD  amLODipine-benazepril (LOTREL) 10-40 MG per capsule Take 1 capsule by mouth daily.   Yes Historical Provider, MD    lovastatin (MEVACOR) 40 MG tablet Take 40 mg by mouth daily.    Yes Historical Provider, MD  Multiple Vitamin (MULTIVITAMIN WITH MINERALS) TABS Take 1 tablet by mouth daily.   Yes Historical Provider, MD  omeprazole (PRILOSEC) 40 MG capsule Take 40 mg by mouth daily.   Yes Historical Provider, MD  sertraline (ZOLOFT) 100 MG tablet Take 100 mg by mouth daily.   Yes Historical Provider, MD    Social History:    reports that he has quit smoking. He does not have any smokeless tobacco history on file. He reports that he does not drink alcohol. His drug history not on file.   Family History:    History reviewed. No pertinent family history.   DIAGNOSIS:  AXIS I  chronic depression with overdose; alcohol abuse, benzodiazepine-dependent   AXIS II  Deffered   AXIS III  See medical notes.   AXIS IV  other psychosocial or environmental problems, problems related to social environment, problems with primary support group and ; mother with whom he lives may provide further information he is unwilling to disclose   AXIS V  51-60 moderate symptoms    Assessment/Plan: Discussed with Dr. Sharl Ma  Dr. Sharl Ma says he is close to discharge.   Expressed concern about pt's habit of stock-piling his bottles  of Xanax in his safe.  Dr. Sharl Ma says he will request visiting RN to check on him once he is home. Pt has other concerns, ie brainstem mass has not been defined and R arm fx may or may not be pathologic.  Pt may be reacting to c/o blurry vision that may or may not be related to inferred pressure on occipit.  These may be coincidental or concomitant or coincidental lesion[s].   RECOMMENDATION:  1. Pt does not need sitter.  2.  Agree with Dr. Sharl Ma to have VN evaluate pt in home setting and encourage pt to turn over the 400-500 pills of Xanax he claims to keep in a safe. 3.  No further psychiatric needs, except to refer him to community psychiatric services.  MD Psychiatrist signs off Candice Lunney J. Ferol Luz,  MD Psychiatrist  '04/25/2012 . 10:08 PM

## 2012-04-25 NOTE — Progress Notes (Signed)
Pt complained of pressure and pain near rt elbow from the splint placed. Mid-level on call text and paged. Ortho tech was paged also. Denyse Amass (ortho tech) responded and was very willing to check on the splint. Came and seen the pt . He added a longer  splint on top of the the original splint and applied from the shoulder down to the wrist then covered with ace wrap. Rt arm was kept elevated. Pt was relieved and felt much better. Will  Continue to monitor.

## 2012-04-26 DIAGNOSIS — G939 Disorder of brain, unspecified: Secondary | ICD-10-CM

## 2012-04-26 DIAGNOSIS — K219 Gastro-esophageal reflux disease without esophagitis: Secondary | ICD-10-CM

## 2012-04-26 DIAGNOSIS — F329 Major depressive disorder, single episode, unspecified: Secondary | ICD-10-CM

## 2012-04-26 DIAGNOSIS — S42309A Unspecified fracture of shaft of humerus, unspecified arm, initial encounter for closed fracture: Secondary | ICD-10-CM

## 2012-04-26 LAB — TROPONIN I: Troponin I: <0.3 ng/mL (ref <0.30)

## 2012-04-26 MED ORDER — DEXAMETHASONE 4 MG PO TABS
4.0000 mg | ORAL_TABLET | Freq: Four times a day (QID) | ORAL | Status: DC
Start: 2012-04-26 — End: 2012-04-26
  Administered 2012-04-26 (×2): 4 mg via ORAL
  Filled 2012-04-26 (×4): qty 1

## 2012-04-26 MED ORDER — ASPIRIN 325 MG PO TABS
325.0000 mg | ORAL_TABLET | ORAL | Status: AC
  Administered 2012-04-26: 325 mg via ORAL
  Filled 2012-04-26: qty 1

## 2012-04-26 MED ORDER — LORAZEPAM 2 MG/ML IJ SOLN
0.5000 mg | Freq: Once | INTRAMUSCULAR | Status: AC
  Administered 2012-04-26: 0.5 mg via INTRAVENOUS
  Filled 2012-04-26: qty 1

## 2012-04-26 MED ORDER — OXYCODONE HCL 5 MG PO TABS: 5.0000 mg | ORAL_TABLET | ORAL | Status: AC | PRN

## 2012-04-26 MED ORDER — MORPHINE SULFATE 2 MG/ML IJ SOLN
1.0000 mg | INTRAMUSCULAR | Status: AC
  Administered 2012-04-26: 1 mg via INTRAVENOUS
  Filled 2012-04-26: qty 1

## 2012-04-26 NOTE — Progress Notes (Signed)
@  0530 pt c/o mid-chest pain, very anxious BP-183/106, HR-75 on tele-monitor, O2 sat-95%RA and RR-18, denies SOB. EKG done and on NSR, O2 2lpm put on via nasal canula. Called NP Craige Cotta Mid-level on call and made new orders carried out and given. Troponin every 6hrs x3 was ordered.Will follow up result.PRN hydralazine 10mg  IV given  also. Pt was on 3 level of pain now and fell better. Will continue to monitor.

## 2012-04-26 NOTE — Care Management Note (Signed)
    Page 1 of 1   04/26/2012     3:36:26 PM   CARE MANAGEMENT NOTE 04/26/2012  Patient:  Alexander Wong, Alexander Wong   Account Number:  [id number]  Date Initiated:  04/26/2012  Documentation initiated by:  Jacquelynn Cree  Subjective/Objective Assessment:   Admitted with possible overdose, rt arm fracture, brain mass. Lives with mother.     Action/Plan:   PT/OT evals- recommending HHPT   Anticipated DC Date:  04/26/2012   Anticipated DC Plan:  HOME W HOME HEALTH SERVICES         Choice offered to / List presented to:  C-1 Patient        HH arranged  HH-1 RN  HH-2 PT  HH-3 OT      [facility] agency  [facility]   Status of service:  Completed, signed off Medicare Important Message given?   (If response is "NO", the following Medicare IM given date fields will be blank) Date Medicare IM given:   Date Additional Medicare IM given:    Discharge Disposition:  HOME W HOME HEALTH SERVICES  Per UR Regulation:  Reviewed for med. necessity/level of care/duration of stay  If discussed at Long Length of Stay Meetings, dates discussed:    Comments:  04/26/12 Spoke with patient about HHC for HHPT, OT and RN. Patient selected [facility] from the [facility] list.Contacted [place] at [facility] and requested HHPT, OT and RN. Patient for discharge to home today. Jacquelynn Cree RN, BSN, CCM

## 2012-04-26 NOTE — Discharge Summary (Signed)
AVs and d/,c instruction /education given to  Patient smoking cessation  Re enforced  Patient demonstrate understanding. Also  To call for follow up appointment in  Am for Dr Venetia Maxon ,Dr Rennis Chris office as  Directed. Pt awaiting friend to be picked up

## 2012-04-26 NOTE — Consult Note (Signed)
Reason for Consult: Mass in brainstem Referring Physician: Dr. Molinda Bailiff Alexander Wong is an 66 y.o. male.  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 He is been evaluated and treated by [name] in the past and has undergone anterior cervical decompression and fusion surgery. The patient tells me that he is to have his lumbar spine evaluated by [name] when he sustained a humerus fracture and right upper extremity. He is aware that he has a mass lesion in his brain stem has been worked up a few years ago at [facility] He was told this is an aneurysm. He's had angiographic studies and was told that treatment of this lesion would be complicated. At that time is having only headaches and neck pain. The patient more recently has been having increasing frequency of falls secondary to some unsteadiness on his feet. In fact his humerus fracture as result of one of those falls. An MRI scan was performed at [facility] hospital this past Monday when the patient was admitted for what was thought to be an overdose. The patient denies any suicidal ideation at this time. The MRI demonstrates that the lesion has increased in size since 2000 and now measuring 1.6-1.8 cm in diameter with pressure on the brainstem. Consultation is now being sought to direct further treatment of this process. The patient is aware that he will likely need to have something done about this, however he desires to be discharged home so that he may take care of some personal items prior to undergoing any surgical or endovascular treatment. He also like to seek any additional opinions that may be appropriate regarding treatment of this process.  Past Medical History  Diagnosis Date  . Hypertension   . Hyperlipidemia   . Chronic back pain   . Chronic narcotic dependence   .  Post-polio syndrome     w/ right sided weakness  . GERD (gastroesophageal reflux disease)   . 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 Cardiac catheterization 2013    @ [facility];  . Coronary angioplasty 2013    @ [facility]    History reviewed. No pertinent family history.  Social History:  reports that he has quit smoking. He does not have any smokeless tobacco history on file. He reports that he does not drink alcohol. His drug history not on file.  Allergies:  Allergies  Allergen Reactions  . Erythromycin Hives    Allergy to entire class of drugs  . Penicillins Hives  . Medrol (Methylprednisolone) Rash    Medications: Not reviewed  Results for orders placed during the hospital encounter of 04/23/12 (from the past 48 hour(s))  TROPONIN I     Status: Normal   Collection Time   04/26/12  7:10 AM      Component Value Range Comment   Troponin I <0.30  <0.30 ng/mL     Dg Humerus Right  04/24/2012  *RADIOLOGY REPORT*  Clinical Data: Right humeral fracture noted on prior MRI.  RIGHT HUMERUS - 2+ VIEW  Comparison: None.  Findings: Fractures of the right humeral neck and midshaft. The humeral neck fractures appear well defined.  The mid shaft fracture margins are indistinct, suggesting this may be subacute.  There is mild anterior displacement of mid shaft  fracture. There is an associated underlying lucency and pathologic fracture is not excluded. Diffuse osteopenia. Osseous irregularity of the distal fibula, in keeping with remote fracture. Visualized portion of the right lung is clear.  IMPRESSION: Fractures of the right humeral neck and mid shaft.  Mid shaft fracture may be pathologic. Is there an underlying malignancy?  Per report, the patient has a prior MRI.  Recommend comparison with those images.   Original Report Authenticated By: Jearld Lesch, M.D.     Review of Systems  HENT: Negative.        History of neck  surgery by Dr. Sherilyn Cooter pool.  Eyes: Negative.   Respiratory: Negative.   Cardiovascular: Negative.   Gastrointestinal: Negative.   Musculoskeletal: Positive for falls.       Increase frequency of falls recently leading to right humerus fracture  Skin: Negative.   Neurological: Positive for weakness.       History of polio involving paralysis of right upper extremity weakness of left shoulder.  Psychiatric/Behavioral:       Patient denies any suicide attempt notes that he hadn't slept in a while which caused him to take more Xanax than he usually took. Notes that he loves life too much to consider anything that severe.   Blood pressure 173/86, pulse 71, temperature 98.2 F (36.8 C), temperature source Oral, resp. rate 18, height 6\' 1"  (1.854 m), weight 72.4 kg (159 lb 9.8 oz), SpO2 97.00%. Physical Exam  Constitutional: He is oriented to person, place, and time. He appears well-developed and well-nourished.  HENT:  Head: Normocephalic and atraumatic.  Eyes: Conjunctivae normal and EOM are normal. Pupils are equal, round, and reactive to light.  Neck: Normal range of motion. Neck supple.  Cardiovascular: Normal rate and regular rhythm.   Respiratory: Effort normal and breath sounds normal.  GI: Bowel sounds are normal.  Musculoskeletal:       Plegic right upper extremity in cast and splint secondary to humerus fracture left upper extremity with no deltoid function bicep tricep grip function 5 out of 5 lower extremities move normally without any spasticity the reflexes are 3+ in the patellae positive Babinski's bilaterally  Neurological: He is alert and oriented to person, place, and time.  Skin: Skin is warm and dry.  Psychiatric: He has a normal mood and affect. His behavior is normal. Judgment and thought content normal.    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 Lesion has increased in size over a two-year  period of time and will likely need definitive treatment which may require both endovascular coiling or trapping and surgical resection secondary to mass effect on the brainstem. I will discuss with [name] and Dr. Mikal Plane and then discussed with the patient appropriate route of care. In the meantime if he desires he may be discharged home and followup can be arranged on an outpatient basis.  Philbert Ocallaghan J 04/26/2012, 8:59 AM

## 2012-04-26 NOTE — Progress Notes (Signed)
Alexander Wong  MRN: [id number] DOB/Age: Jun 08, 1946 66 y.o. Physician: Lynnea Maizes, Wong.D.      R Humerus x rays reviewed with good alignment at midshaft and proximal humeral fracture sites. Recommend continue with splint and sling, f/u my office 1 month for repeat x-rays. Discussed with Dr. Sharl Ma. Alexander Wong 04/26/2012, 3:25 PM

## 2012-04-26 NOTE — Progress Notes (Signed)
[facility]  Patient Status: New  [facility] is providing the following services: RN, PT and MSW  If patient discharges after hours, please call [phone].   Alexander Wong 04/26/2012, 12:07 PM

## 2012-04-26 NOTE — Consult Note (Signed)
Physician Discharge Summary  Alexander Wong:[id number] DOB: Sep 06, 1945 DOA: 04/23/2012  PCP: Alexander Hakim, MD  Admit date: 04/23/2012 Discharge date: 04/26/2012  Time spent: 45 min minutes  Recommendations for Outpatient Follow-up:  1. Follow up Ortho in 4 weeks 2. 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 He is been evaluated and treated by Dr. Lelon Wong in the past and has undergone anterior cervical decompression and fusion surgery. The patient tells me that he is to have his lumbar spine evaluated by Dr. Dutch Wong when he sustained a humerus fracture and right upper extremity. He is aware that he has a mass lesion in his brain stem has been worked up a few years ago at [facility]. He was told this is an aneurysm. He's had angiographic studies and was told that treatment of this lesion would be complicated. At that time is having only headaches and neck pain. The patient more recently has been having increasing frequency of falls secondary to some unsteadiness on his feet. In fact his humerus fracture as result of one of those falls. An MRI scan was performed at [facility] hospital this past Monday when the patient was admitted for what was thought to be an overdose. The patient denies any suicidal ideation at this time. The MRI demonstrates that the lesion has increased in size since 2000 and now measuring 1.6-1.8 cm in diameter with pressure on the brainstem.  Consultation is now being sought to direct further treatment of this process. The patient is aware that he will likely need to have something done about this, however he desires to be discharged home so that he may take care of some personal items prior to undergoing any surgical or endovascular treatment. He also like to seek any additional opinions that may be appropriate regarding treatment of this process.   Hospital Course:   Right humerus fracture Patient was seen by orthopedics, sling was placed. No operative intervention needed at this time. Will send on prn oxycodone for pain  Vertebral artery aneurysm Patient was seen by neurosurgery and he was found to have large aneurysm of vertebral artery causing pressure on the brainstem at the level of the medulla,  which may require both endovascular coiling or trapping and surgical resection secondary to mass effect on the brainstem. At this time Dr Alexander Wong is going to arrange outpatient follow up for the above procedure. Patient will also be given the information of Dr Alexander Wong office to call and make appointment in case Dr Alexander Wong does not call him.  ? Suicidal ideation As per family there were reports from patient's family that the patient had attempted to overdose on percocet pills 3 days ago he was .Involunttary commited.suicide precautions were placed, psych saw the patient and cleared him. Patient denies any suicidal ideation.  Delirium Patient was delirious at the time of admission, thought to be secondary to medications. We will discontinue xanax at this time.   Procedures: None  Consultations Orthopedics Psychiatry Neurosurgery   Discharge Exam:  Filed Vitals:   04/26/12 0744 04/26/12 0940 04/26/12 1015 04/26/12 1214  BP: 173/86  156/91 160/87  Pulse: 71  79 72  Temp:  97.7 F (36.5 C)  97.9 F (36.6 C)  TempSrc: Oral Oral  Oral  Resp:   18 18  Height:      Weight:      SpO2: 97%  96% 95%    General: In no acute  distress Cardiovascular: S1S2 RRR Respiratory: Clear bilaterally Ext : No edema  Discharge Instructions  Discharge Orders    Future Orders Please Complete By Expires   Diet - low sodium heart healthy      Increase activity slowly          Medication List     As of 04/26/2012  2:50 PM    STOP taking these medications         ALPRAZolam 0.5 MG tablet   Commonly known as: XANAX      TAKE these medications         acetaminophen 500 MG tablet   Commonly known as: TYLENOL   Take 1,000 mg by mouth every 6 (six) hours as needed. For pain      acyclovir 400 MG tablet   Commonly known as: ZOVIRAX   Take 400 mg by mouth 2 (two) times daily.      amLODipine-benazepril 10-40 MG per capsule   Commonly known as: LOTREL   Take 1 capsule by mouth daily.      lovastatin 40 MG tablet   Commonly known as: MEVACOR   Take 40 mg by mouth daily.      multivitamin with minerals Tabs   Take 1 tablet by mouth daily.      omeprazole 40 MG capsule   Commonly known as: PRILOSEC   Take 40 mg by mouth daily.      oxyCODONE 5 MG immediate release tablet   Commonly known as: Oxy IR/ROXICODONE   Take 1 tablet (5 mg total) by mouth every 4 (four) hours as needed for pain.      sertraline 100 MG tablet   Commonly known as: ZOLOFT   Take 100 mg by mouth daily.           Follow-up Information    Follow up with Wong,Alexander M, MD. In 4 weeks.   Contact information:   [address], [address] [place] [address] [phone]       Follow up with Alexander Dama, MD. In 2 weeks.   Contact information:   [address], [address] [place] [address] [phone]       Follow up with Alexander Hakim, MD. In 2 weeks.   Contact information:   [address], [address]   [place] [address] [phone]           The results of significant diagnostics from this hospitalization (including imaging, microbiology, ancillary and  laboratory) are listed below for reference.    Significant Diagnostic Studies: Dg Humerus Right  04/24/2012  *RADIOLOGY REPORT*  Clinical Data: Right humeral fracture noted on prior MRI.  RIGHT HUMERUS - 2+ VIEW  Comparison: None.  Findings: Fractures of the right humeral neck and midshaft. The humeral neck fractures appear well defined.  The mid shaft fracture margins are indistinct, suggesting this may be subacute.  There is mild anterior displacement of mid shaft fracture. There is an associated underlying lucency and pathologic fracture is not excluded. Diffuse osteopenia. Osseous  irregularity of the distal fibula, in keeping with remote fracture. Visualized portion of the right lung is clear.  IMPRESSION: Fractures of the right humeral neck and mid shaft.  Mid shaft fracture may be pathologic. Is there an underlying malignancy?  Per report, the patient has a prior MRI.  Recommend comparison with those images.   Original Report Authenticated By: Jearld Lesch, M.D.     Microbiology: No results found for this or any previous visit (from the past 240 hour(s)).   Labs: Basic Metabolic Panel:  Lab 04/24/12 1610  NA 138  K 3.6  CL 101  CO2 22  GLUCOSE 129*  BUN 15  CREATININE 0.45*  CALCIUM 9.7  MG --  PHOS --   Liver Function Tests:  Lab 04/24/12 0255  AST 17  ALT 8  ALKPHOS 144*  BILITOT 0.3  PROT 8.4*  ALBUMIN 3.9   No results found for this basename: LIPASE:5,AMYLASE:5 in the last 168 hours No results found for this basename: AMMONIA:5 in the last 168 hours CBC:  Lab 04/24/12 0255  WBC 6.8  NEUTROABS --  HGB 13.2  HCT 40.9  MCV 84.3  PLT 356   Cardiac Enzymes:  Lab 04/26/12 1112 04/26/12 0710  CKTOTAL -- --  CKMB -- --  CKMBINDEX -- --  TROPONINI <0.30 <0.30   BNP: BNP (last 3 results) No results found for this basename: PROBNP:3 in the last 8760 hours CBG: No results found for this basename: GLUCAP:5 in the last 168  hours     Signed:  Zimal Weisensel S  [facility] 04/26/2012, 2:50 PM

## 2012-04-30 ENCOUNTER — Other Ambulatory Visit: Payer: Self-pay | Admitting: Neurological Surgery

## 2012-04-30 DIAGNOSIS — I726 Aneurysm of vertebral artery: Secondary | ICD-10-CM

## 2012-05-01 ENCOUNTER — Other Ambulatory Visit: Payer: Medicare Other

## 2012-05-19 ENCOUNTER — Emergency Department: Payer: Self-pay | Admitting: Emergency Medicine

## 2012-07-21 DEATH — deceased

## 2013-09-10 NOTE — Progress Notes (Signed)
This encounter was created in error - please disregard.

## 2014-03-14 IMAGING — CT CT OF THE LEFT SHOULDER WITHOUT CONTRAST
1 of 2 series · 9 of 14 positions shown, 12 images · non-contrast
Comparison: None

REASON FOR EXAM: left shoulder pain eval for dislocation  eval for neck
of humerus fracture
COMMENTS:

PROCEDURE:     CT  - CT SHOULDER LEFT WO  - November 16, 2011  [DATE]
RESULT:     History: Left shoulder pain
TECHNIQUE: Multiple axial images obtained of the left shoulder with sagittal
and coronal reformatted images provided.

[Series 3: mpr · axial · 0.45mm/px · z∈[-245,-78]mm · 9 of 260 slices shown, 12 images]
[im 26/260  soft-tissue]
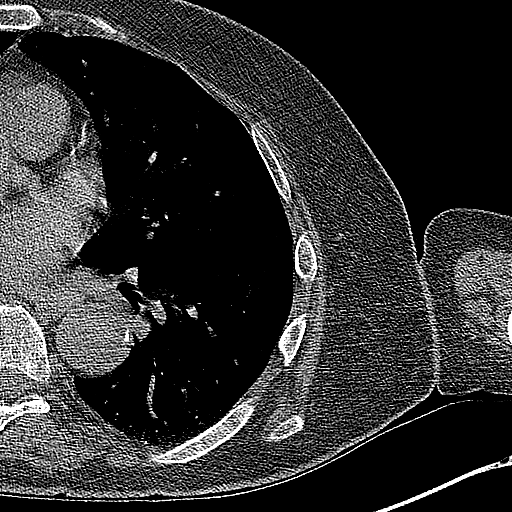
[im 26/260  bone]
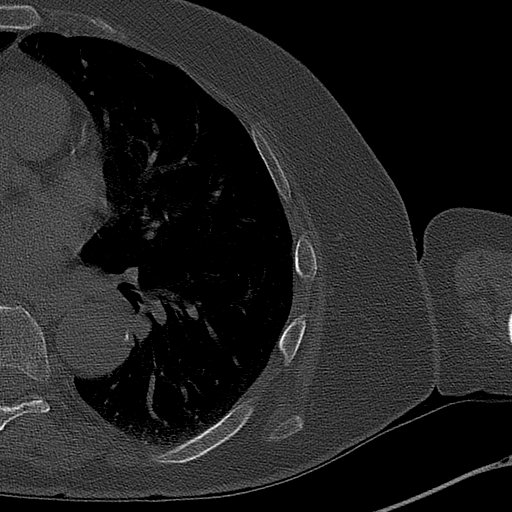
[im 52/260  bone]
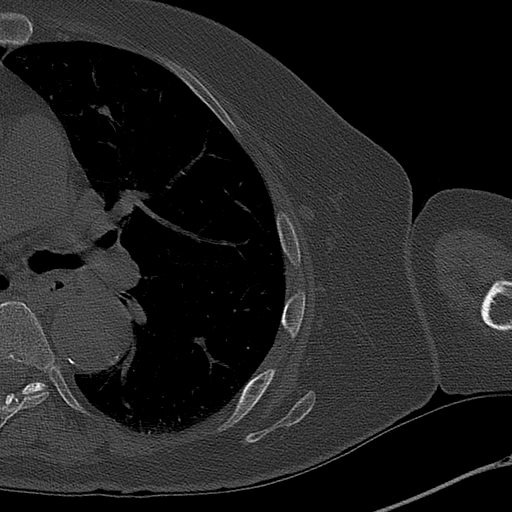
[im 78/260  bone]
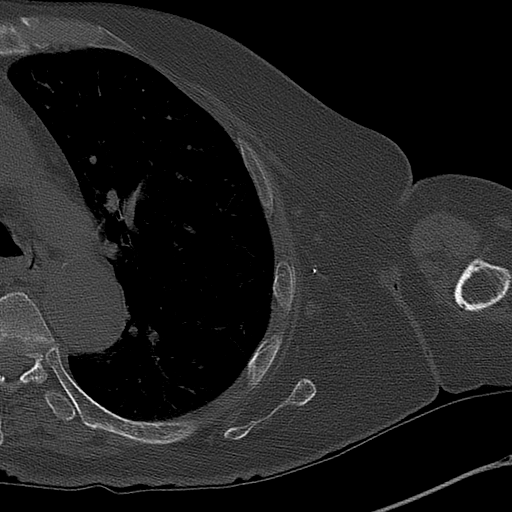
[im 104/260  bone]
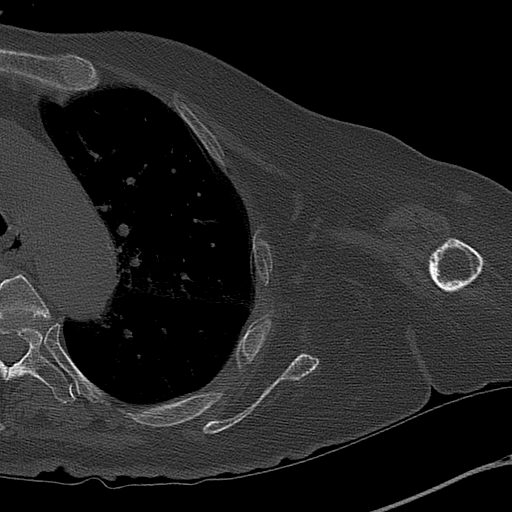
[im 130/260  soft-tissue]
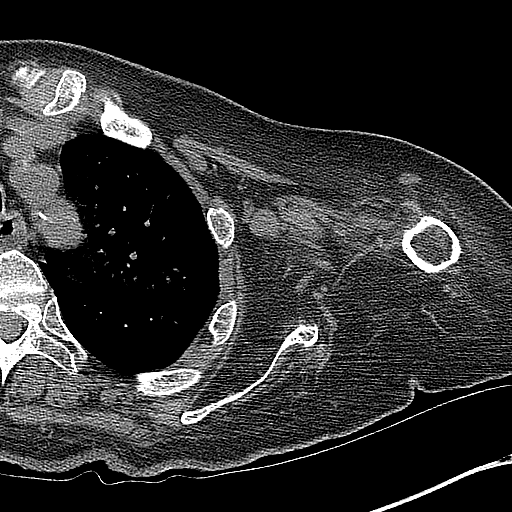
[im 130/260  bone]
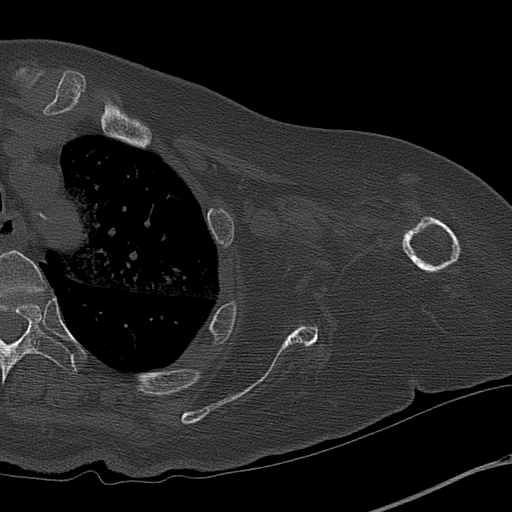
[im 156/260  bone]
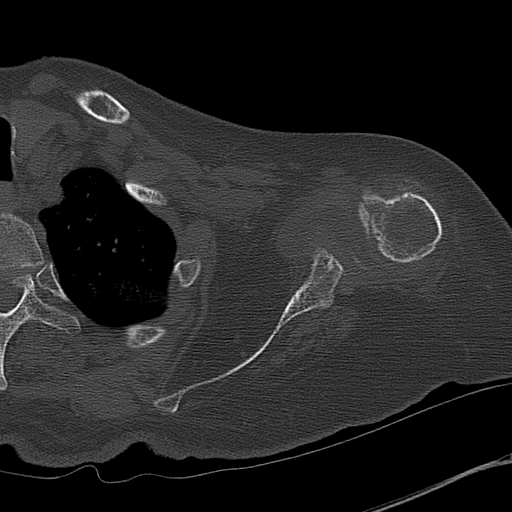
[im 182/260  bone]
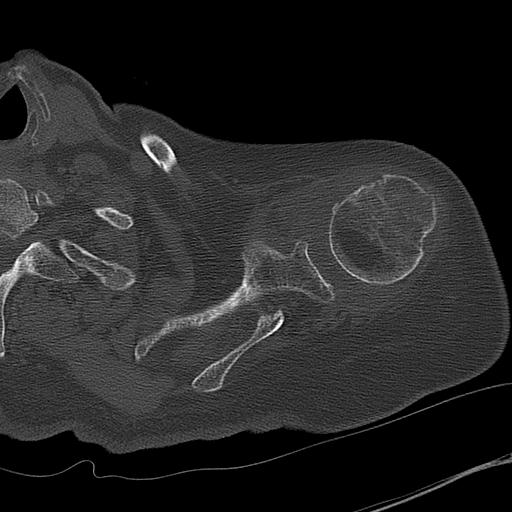
[im 208/260  bone]
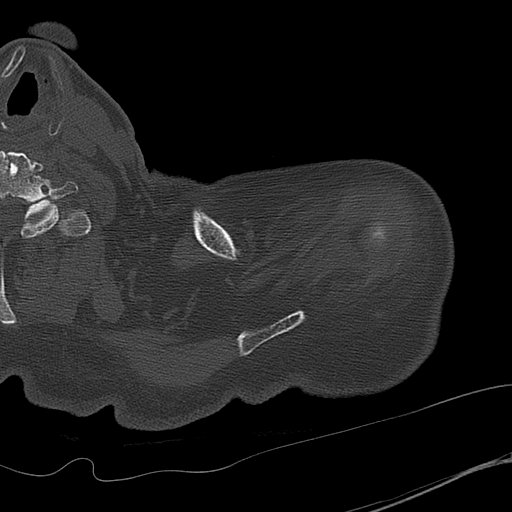
[im 234/260  soft-tissue]
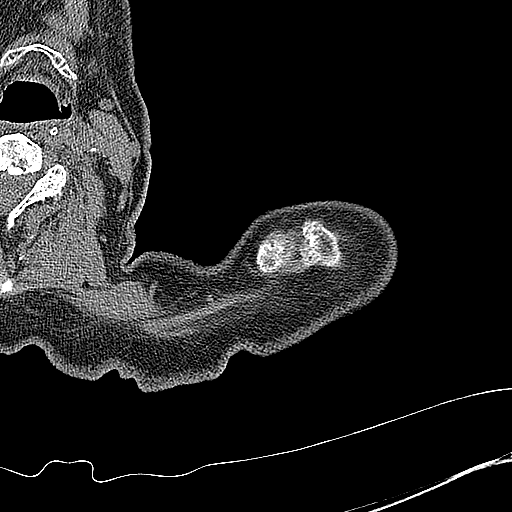
[im 234/260  bone]
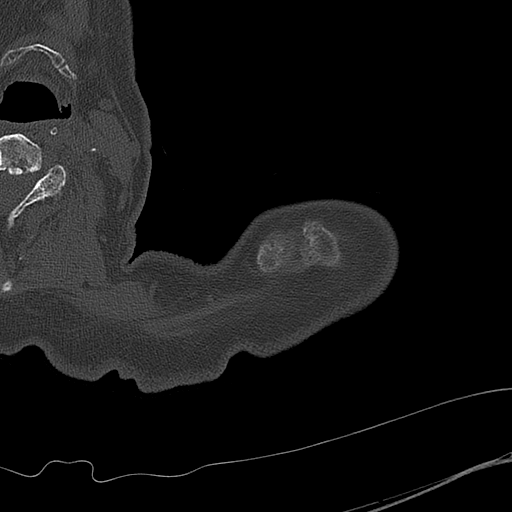

[9 of 14 positions shown; findings below may reference images not displayed]

FINDINGS: There is a comminuted fracture of the left humeral head extending into the
greater and lesser tuberosity. There is no significant displacement or
angulation. There is a left joint effusion. There is no glenohumeral
dislocation. There mild degenerative changes of the left glenohumeral joint.
There are mild degenerative changes of the left AC joint. There is a
well-circumscribed lucent lesion in the distal left clavicle likely
resenting a benign fibro-osseous lesion.

The visualized portion of the left lung demonstrates emphysematous change.
IMPRESSION: Comminuted fracture of the left humeral head primarily involving the
surgical neck with extension into the greater and lesser tuberosity.

## 2014-10-12 NOTE — Discharge Summary (Signed)
PATIENT NAME:  Alexander Wong, Alexander Wong MR#:  [id number] DATE OF BIRTH:  18-Nov-1945  DATE OF ADMISSION:  12/18/2011 DATE OF DISCHARGE:  12/24/2011  ADMITTING DIAGNOSES:  1. Decrease in responsiveness.  2. Respiratory distress.   DISCHARGE DIAGNOSES:  1. Acute encephalopathy likely related to unintentional drug overdose.  2. Acute hypoxic respiratory failure, likely related to acute encephalopathy with inability to protect airway requiring short-term mechanical ventilation.  3. Status post cardiopulmonary resuscitation initially when he was found unresponsive.  4. Possible seizure, is on Dilantin per neurology recommendation.  5. Fever, possible infectious, could be also central.  6. Hematuria.  7. Electrolyte imbalances including hypokalemia, hypomagnesemia.  8. Elevated troponins with chest pain status post cardiac catheterization which shows nonobstructive coronary artery disease.  9. Elevated AST, ALT.  10. Hypertension.  11. Hyperlipidemia.  12. Generalized weakness.  13. Chronic back pain.  14. Chronic narcotic dependence and use.  15. Polio syndrome with right-sided weakness.  16. Gastroesophageal reflux disease.  17. 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     CONSULTANTS:  1. Cardiology with Dr. Rockey Situ. 2. Pulmonary with Dr. Mortimer Fries. 3. Neurological consultation with Dr. Loletta Specter.   LABORATORY, DIAGNOSTIC, AND RADIOLOGICAL DATA: EEG showed no evidence of seizure activity. There was profound slowing in the background. CT scan of the head showed no acute abnormality. Chest x-ray no acute infiltrate. Blood cultures and urine cultures were negative. WBC count 10.1, hemoglobin 12.8, glucose 169, creatinine 1.6. Troponin ranging from 0.14 to 0.41. Urine TUDS for positive for benzodiazepines, opiates and methadone. Cardiac catheterization showed minor luminal irregularities, mild circumflex, diffuse 50% stenosis.   HOSPITAL COURSE: Please refer to history and physical done by the admitting physician.  Patient is a 69 year old white male with history of polio, right upper extremity weakness, chronic back pain who was found unresponsive by his friend. Cardiopulmonary resuscitation was initiated on site. Patient had difficult intubation. He was admitted for altered mental status as well as acute respiratory failure.  1. Acute hypoxic respiratory failure felt to be due to acute encephalopathy and altered mental status. There was some concern that he might also aspirated. He was kept in the Intensive Care Unit and subsequently extubated. On 12/20/2011 patient after being moved from the Intensive Care Unit continued to have hypoxia, however, with ambulation, incentive spirometry he was on room air today and doing much better from this respiratory standpoint.  2. Patient initially had fever. It was felt likely infection, possible aspiration pneumonia, could be central in origin. His blood cultures, urine culture all remained negative. Chest x-ray showed no infiltrate. He was treated with Levaquin which will be continued at home for a few more days.  3. Acute encephalopathy, altered mental status. This was felt to be due to drugs. Patient was taking benzodiazepines, opioids at home, however, methadone was positive in his TUDS. On questioning he reported that he was given some doses by his primary care physician. Certainly this could have been related to methadone ingestion. He expressed no suicidal or homicidal ideation. Patient encouraged only to take medications that are prescribed to him.  4. Seizures. Patient had seizure-like activity in the ED after intubation. He was seen by Dr. Loletta Specter, had an EEG. Did not show evidence of epileptiform seizures. Patient was initially on IV fosphenytoin and subsequently switched over to p.o. Dilantin. He had not had any further seizure activity since admission.  5. Hematuria. Patient had hematuria initially likely due to traumatic Foley. This resolved with removal of the Foley.   6. Chest  pain with elevated troponin. Patient was seen by cardiology. He had an echo which showed some inferior apical hypokinesis. Patient underwent a cardiac catheterization which showed nonobstructive disease. Medical management was recommended.  7. Acute renal failure likely due to dehydration. This resolved with IV fluids.  8. At this time patient is ambulating without difficulty. He is not requiring any oxygen. He is stable for discharge.   DISCHARGE MEDICATIONS:  1. Valtrex 1 gram b.i.d. as taking previously.  2. Lovastatin 40, 1 tab p.o. daily.  3. Allegra 180 daily.  4. AcipHex 20 daily.  5. Sertraline 100, 1 tab p.o. daily.  6. Lotrel 10/40, 1 tab p.o. daily.  7. Alprazolam 0.5, 1 tab 4 times per day as needed. 8. Nasonex 1 puff daily.  9. Multivitamin beta-carotene 1 tab p.o. daily.  10. Cyclobenzaprine 5 mg q.4 p.r.n.  11. Dilantin 400 mg p.o. at bedtime. 12. Levaquin 500 p.o. daily 1 tab every 24 hours for four more days. 13. Percocet 5/325 q.6 p.r.n. for pain.   DIET: Low sodium.   ACTIVITY: As tolerated.   TIMEFRAME FOR FOLLOW UP: 1 to 2 weeks with primary M.D., Dr. Caryn Section.   NOTE: Patient is told not to take additional medications than was prescribed.   TIME SPENT: 35 minutes spent.   ____________________________ Lafonda Mosses. Posey Pronto, MD shp:cms D: 12/25/2011 08:07:51 ET T: 12/26/2011 13:32:04 ET JOB#: [id number]  cc: Lafreda Casebeer H. Posey Pronto, MD, <Dictator> Kirstie Peri. Caryn Section, MD  Alric Seton MD ELECTRONICALLY SIGNED 12/27/2011 8:24

## 2014-10-12 NOTE — H&P (Signed)
PATIENT NAME:  Alexander Wong, Alexander Wong MR#:  [id number] DATE OF BIRTH:  02-13-1946  DATE OF ADMISSION:  12/18/2011  PRIMARY CARE PHYSICIAN:  Dr. Caryn Section  REFERRING PHYSICIAN:  Dr. Hassell Done  CHIEF COMPLAINT: Unresponsiveness and respiratory distress.   HISTORY OF PRESENT ILLNESS: 69 year old Caucasian male with a history of polio syndrome, seasonal allergies, hypertension, hyperlipidemia, chronic back pain, chronic narcotic dependence, HSV on chronic antiviral, and gastroesophageal reflux disease who was brought to the ED due to unresponsiveness today. The patient was intubated on ventilation. According to Dr.  Hassell Done and the patient's mother, the patient was fine this morning until noontime when he was noted to be unresponsive and had minimal respiratory effort at home. The patient's friend did cardiopulmonary resuscitation on site and his mother called EMS. The EMS attempted intubation? twice without success. The patient arrived bagged with minimal respiratory effort. He was intubated about 4:00 p.m. by Dr. Hassell Done and placed on ventilation. According to Dr. Hassell Done the patient had seizure-like activity after intubation. In addition, the patient was treated with Narcan without response before intubation.    PAST MEDICAL HISTORY: As mentioned above:  1. Hypertension.  2. Hyperlipidemia.  3. Chronic back pain.  4. Chronic narcotic dependence.  5. Polio syndrome with right-sided weakness. 6. Gastroesophageal reflux disease. 7. HSV on chronic antiviral.   SOCIAL HISTORY: The patient has been smoking for a long time, since a teenager according to his mother, but no alcohol. She does not know whether the patient used drugs or not.   FAMILY HISTORY: Heart attack, hypertension, kidney failure.   ALLERGIES: EES, Medrol, penicillin.   HOME MEDICATIONS:  1. AcipHex 20 mg p.o. daily.  2. Allegra 180 mg p.o. daily.  3. Alprazolam 0.5 mg p.o. 4 times a day.  4. Cyclobenzaprine 5 mg p.o. 4 times a day.   5. Cyclobenzaprine 5 mg p.o. every 4 hours p.r.n.  6. Lotrel 10 mg/40 mg oral capsule, 1 p.o. daily.  7. Lovastatin 40 mg p.o. daily.  8. Multivitamin with beta-carotene 1 p.o. daily.  9. Nasonex 1 tab once daily.  10. Oxycodone 10 mg 2 tablets p.o. every 4 hours.  11. Sertraline 100 mg p.o. once daily.  12. Valtrex 1 gram tablet 1 p.o. b.i.d.    REVIEW OF SYSTEMS: Unable to obtain due to the patient's intubation status.   PHYSICAL EXAMINATION:  VITALS: Temperature 95.4, blood pressure 90/68, pulse 108. Respirations 20, oxygen saturation 97% on ventilation with FiO2 100%.   GENERAL: The patient is unresponsive on ventilation.    HEENT: Pupils are round, equal, and reactive to light. Unable to examine ENT.   NECK: Supple. No JVD or carotid bruits. No lymphadenopathy. No thyromegaly.   CARDIOVASCULAR: S1, S2 regular rate and rhythm. No murmurs or gallops.   PULMONARY: Bilateral air entry. No wheezing or rales.   ABDOMEN: Soft. No distention. Bowel sounds present. No organomegaly.   EXTREMITIES: No edema, clubbing, or cyanosis. No calf tenderness. Strong bilateral pedal pulses.   SKIN: No rash or jaundice. No bruises.     NEUROLOGIC: The patient is unresponsive on intubation. Unable to examine at this time.   LABORATORY DATA: ABG show pH 7.3, pCO2 44, pO2 165 with FiO2 of 100%.  Urinalysis negative. Chest x-ray: Hyperinflation of the left lung. Mild opacity in the right lung base likely related to atelectasis. Infection and aspiration are not excluded. WBC 10.8, hemoglobin 12.8, platelets 267, troponin 0.14. CK 110, CK-MB 2.8, glucose 169, BUN 22, creatinine 1.56, sodium 142, potassium 3.5, chloride  103, bicarbonate 24. EKG shows normal sinus rhythm at 74 beats per minute with prolonged QT. Nonspecific ST abnormality.   IMPRESSION:  1. Altered mental status.  2. Acute respiratory failure possibly due to drug overdose.  3. Elevated troponin possibly due to cardiopulmonary  resuscitation or demand ischemia.  4. Acute renal failure.  5. Hypertension.  6. Hyperlipidemia.  7. History of polio syndrome, chronic back pain, chronic narcotic dependence, gastroesophageal reflux disease, HSV.   PLAN OF TREATMENT:   1. The patient will be admitted to the Critical Care Unit. We will continue telemetry monitor, continue ventilation. We will get a pulmonary consult. Give nebulizer and try to wean off ventilation tomorrow.  2. We will get a urine toxicology.  3. Keep n.p.o. except medications. We will give IV fluids, D5 normal saline 100 mL/h and follow up BMP.  4. We will follow up troponin level and lipid panel, start aspirin and Zocor. We will get a cardiology consult  5. We will hold all home medications the patient is taking. 6. GI and deep vein thrombosis prophylaxis.   I discussed the patient's critical situation and plan of treatment with the patient's mother and other family member.          TIME SPENT: About 65 minutes.    ____________________________ Demetrios Loll, MD qc:bjt D: 12/18/2011 20:25:11 ET T: 12/19/2011 07:01:40 ET JOB#: [id number]  cc: Demetrios Loll, MD, <Dictator> Kirstie Peri. Caryn Section, MD Demetrios Loll MD ELECTRONICALLY SIGNED 12/21/2011 14:18

## 2014-10-12 NOTE — Consult Note (Signed)
General Aspect 69 year old Caucasian male with a history of polio syndrome, hypertension, hyperlipidemia, chronic back pain, chronic narcotic dependence, HSV on chronic antiviral therapy, who was brought to the ED with unresponsivness. cardiology was consulted for elevated cardiac enz.   the patient was doing well yesterday until noontime when he was noted to be unresponsive and had minimal respiratory effort at home. The patient's friend did cardiopulmonary resuscitation on site and his mother called EMS. The EMS attempted intubation without success. The patient arrived bagged with minimal respiratory effort. He was intubated about 4:00 p.m. by Dr. Hassell Done and placed on ventilation. According to Dr. Hassell Done the patient had seizure-like activity after intubation. In addition, the patient was treated with Narcan without response before intubation.    Tox screen showed positive opiated, benzos and methadone. No known cardiac hx.  PAST MEDICAL HISTORY:  1. Hypertension.  2. Hyperlipidemia.  3. Chronic back pain.  4. Chronic narcotic dependence.  5. Polio syndrome with right-sided weakness. 6. Gastroesophageal reflux disease. 7. HSV on chronic antiviral.  8.           Long smoking hx   SOCIAL HISTORY: The patient has been smoking for a long time, since a teenager according to his mother, but no alcohol. She does not know whether the patient used drugs or not.   FAMILY HISTORY: Heart attack, hypertension, kidney failure.   Physical Exam:   GEN well developed, well nourished, no acute distress    HEENT red conjunctivae    NECK No masses    RESP clear BS    CARD Regular rate and rhythm  No murmur    ABD denies tenderness  soft    LYMPH negative neck    EXTR negative edema    SKIN normal to palpation    NEURO unable to test    [facility] sedated, intubated   Review of Systems:   Subjective/Chief Complaint Intubated and sedated, EEG in progress    ROS Pt not able to provide ROS     Medications/Allergies Reviewed Medications/Allergies reviewed     Back Pain, Chronic:    Anxiety:    polio:    Back Surgery:   Home Medications: Medication Instructions Status  cyclobenzaprine 5 mg oral tablet 1  orally 4 times a day  Active  cyclobenzaprine 5 mg oral tablet 1  orally every 4 hours as needed   Active  Multi Vits with Beta Carotene 1   once a day  Active  Nasonex 1 puff(s)  once a day  Active  oxycodone 10 mg oral tablet 2  orally every 4 hours  Active  alprazolam 0.5 mg oral tablet 1  orally 4 times a day  Active  Lotrel 10 mg-40 mg oral capsule 1  orally once a day  Active  sertraline 100 mg oral tablet 1  orally once a day  Active  Aciphex 20 mg oral enteric coated tablet 1  orally once a day  Active  Allegra 180 mg oral tablet 1  orally once a day  Active  lovastatin 40 mg oral tablet 1  orally once a day  Active  Valtrex 1 g oral tablet 1  orally 2 times a day  Active   Lab Results:  Routine Chem:  01-Jul-13 01:04    Result Comment troponin - RESULTS VERIFIED BY REPEAT TESTING.  - prev c/ @ 1656 12/18/11 mpg  Result(s) reported on 19 Dec 2011 at 02:16AM.   Cholesterol, Serum 83   Triglycerides,  Serum 65   HDL (INHOUSE) 40   VLDL Cholesterol Calculated 13   LDL Cholesterol Calculated 30 (Result(s) reported on 19 Dec 2011 at 02:12AM.)   Glucose, Serum  115   BUN  25   Creatinine (comp) 1.23   Sodium, Serum 145   Potassium, Serum 4.4   Chloride, Serum  111   CO2, Serum 23   Calcium (Total), Serum  7.7   Anion Gap 11   Osmolality (calc) 294   eGFR (African American) >60   eGFR (Non-African American) >60 (eGFR values <29m/min/1.73 m2 may be an indication of chronic kidney disease (CKD). Calculated eGFR is useful in patients with stable renal function. The eGFR calculation will not be reliable in acutely ill patients when serum creatinine is changing rapidly. It is not useful in  patients on dialysis. The eGFR calculation may not be  applicable to patients at the low and high extremes of body sizes, pregnant women, and vegetarians.)   Magnesium, Serum  1.7 (1.8-2.4 THERAPEUTIC RANGE: 4-7 mg/dL TOXIC: > 10 mg/dL  -----------------------)  Cardiac:  01-Jul-13 01:04    Troponin I  0.22 (0.00-0.05 0.05 ng/mL or less: NEGATIVE  Repeat testing in 3-6 hrs  if clinically indicated. >0.05 ng/mL: POTENTIAL  MYOCARDIAL INJURY. Repeat  testing in 3-6 hrs if  clinically indicated. NOTE: An increase or decrease  of 30% or more on serial  testing suggests a  clinically important change)  Routine Hem:  01-Jul-13 01:04    WBC (CBC) 7.0   RBC (CBC) 4.41   Hemoglobin (CBC)  12.5   Hematocrit (CBC)  39.8   Platelet Count (CBC)  144   MCV 90   MCH 28.4   MCHC  31.4   RDW  16.6   Neutrophil % 84.4   Lymphocyte % 7.8   Monocyte % 7.7   Eosinophil % 0.0   Basophil % 0.1   Neutrophil # 5.9   Lymphocyte #  0.5   Monocyte # 0.5   Eosinophil # 0.0   Basophil # 0.0 (Result(s) reported on 19 Dec 2011 at 01:46AM.)   EKG:   Interpretation NSR with rate 74 bpm, V4 to V6 with nonspcific ST ABN    PCN: Hives  Other - See Comment: Hives  EES: Hives  Medrol: Rash  Vital Signs/Nurse's Notes: **Vital Signs.:   01-Jul-13 08:00   Vital Signs Type Routine   Temperature Temperature (F) 100.3   Celsius 37.9   Temperature Source oral   Pulse Pulse 84   Respirations Respirations 19   Systolic BP Systolic BP 1161  Diastolic BP (mmHg) Diastolic BP (mmHg) 69   Mean BP 89   Pulse Ox % Pulse Ox % 96   Pulse Ox Activity Level  At rest   Oxygen Delivery Ventilator Assisted   Pulse Ox Heart Rate 878    Impression 69year old Caucasian male with a history of polio syndrome, hypertension, hyperlipidemia, chronic back pain, chronic narcotic dependence, HSV on chronic antiviral therapy, who was brought to the ED with unresponsivness.   1)Elevated cardiac enz: Minimal elevated in the setting of acute respiratory distress. CK and  CKMB normal Do not think this is a primary cardiac event/ischemic event --Echo pending. Will look for wall motion ABN. --medical management at this time.  2) Unresponsive/encephalopathy//Acute respiratory distress Seizure type activity seen Etiology uncertain. medication related? Followed by Dr. KMortimer FriesOn 50% FiO2  3) Chronic pain On methadone  4) HTN: Could restart outpt meds when tolerating po.  Electronic Signatures: Ida Rogue (MD)  (Signed 01-Jul-13 13:49)  Authored: General Aspect/Present Illness, History and Physical Exam, Review of System, Past Medical History, Home Medications, Labs, EKG , Allergies, Vital Signs/Nurse's Notes, Impression/Plan   Last Updated: 01-Jul-13 13:49 by Ida Rogue (MD)

## 2014-10-12 NOTE — Consult Note (Signed)
PATIENT NAME:  Alexander Wong, Alexander Wong MR#:  [id number] DATE OF BIRTH:  1946-03-20  DATE OF CONSULTATION:  12/19/2011  REFERRING PHYSICIAN:  Mariane Duval, Wong  CONSULTING PHYSICIAN:  Alexander Cobb. Loletta Specter, Wong  HISTORY: 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 He was admitted 12/18/2011 with unresponsiveness and is referred for evaluation of question of seizures. History comes from his hospital chart, his hospital records, and outpatient notes from Dr. Manuella Wong.   The patient was brought into the Emergency Room in the afternoon of 12/18/2011 by Alexander Wong summoned by his mother who reportedly found the patient unresponsive with minimal respiratory effort. She reported he had been in his usual state of health, fine that day through noontime. The patient reported that a friend performed CPR. Alexander Wong attempted intubation without success.   He was intubated in the Emergency Room by Dr. Hassell Wong at 4:00 p.m. It was reported that Dr. Hassell Wong witnessed a seizure after intubation was performed. Brain CT scan shows no acute process. Arterial blood gas at 6:40 p.m. on the ventilator with an FiO2 of 100% included pH 7.30, pCO2 44, pO2 165, and saturation 93%. Urine drug screen was positive for benzodiazepine, opiate, methadone. Blood sugar shortly after arrival at 4:08 p.m. was 169. Troponin I was mildly increased at 0.14 and 0.22; CPK and MB values were normal.   On the ventilator in the  Critical Care Unit, he was sedated early 12/19/2011 secondary to becoming restless and reaching his left hand to pull on the endotracheal  tube. At 2:00 p.m. on July 1st, fentanyl and Versed were discontinued and he was continued on Precedex. The patient has no prior history of seizures. Of note, it was reported that his unresponsive state was unchanged when he was given Alexander Wong.   PHYSICAL EXAMINATION: The patient is a well-nourished white gentleman examined lying semisupine in the Critical Care Unit, notable for atrophy of the right upper extremity. He was on a ventilator with rate 12 breaths per minute. Blood pressure was140/83 with heart rate 61 and no fever. He had a well-healed scar from remote cervical spine surgery and was otherwise normocephalic without evidence of trauma. His neck was supple to passive movement.   The patient was found lying with eyes closed with spontaneous movements limited to respirations by the ventilator. To loud address, he slowly partially opened eyelids, visually followed the examiner to right and left with conjugate gaze, and appropriately made a small nod or shake of the head for yes and no responses. He could be induced to grip with fairly good strength in the left hand and was able to lift the left arm off the bed.   IMPRESSION:  1. History of witnessed seizure in the Emergency Room.  2. Unresponsive state precipitating call to Alexander Wong and resulting in  admission, of unclear etiology, but suspicious for postictal state following a witnessed seizure.   RECOMMENDATIONS:  1. I agree with his present work-up and treatment in hospital including imaging and laboratory studies.  2. He will be  started on anticonvulsant medication treatment with IV load of Alexander Wong 1000 mg and maintenance dosing at 100 mg IV every 8 hours, with blood levels checked the next few mornings.  3. I do not see indication at this time for lumbar puncture.   I appreciate being asked to see this  pleasant and interesting gentleman.  ____________________________ Alexander Cobb. Loletta Specter, Wong prc:cbb D: 12/20/2011 15:15:22 ET T: 12/20/2011 16:09:22 ET JOB#: [id number]  cc: Alexander Cobb. Loletta Specter, Wong, <Dictator> Alexander Wong ELECTRONICALLY SIGNED 01/26/2012 15:14
